# Patient Record
Sex: Female | Born: 1985 | Race: Black or African American | Hispanic: No | Marital: Married | Smoking: Never smoker
Health system: Southern US, Community
[De-identification: ages and names within clinical notes are randomized; demographics above are authoritative.]

## PROBLEM LIST (undated history)

## (undated) DIAGNOSIS — T7840XA Allergy, unspecified, initial encounter: Secondary | ICD-10-CM

## (undated) DIAGNOSIS — Z9889 Other specified postprocedural states: Secondary | ICD-10-CM

## (undated) DIAGNOSIS — Z789 Other specified health status: Secondary | ICD-10-CM

## (undated) DIAGNOSIS — R112 Nausea with vomiting, unspecified: Secondary | ICD-10-CM

## (undated) HISTORY — DX: Allergy, unspecified, initial encounter: T78.40XA

---
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
## 2004-01-04 ENCOUNTER — Other Ambulatory Visit: Admission: RE | Admit: 2004-01-04 | Discharge: 2004-01-04 | Payer: Self-pay | Admitting: Internal Medicine

## 2005-08-30 ENCOUNTER — Other Ambulatory Visit: Admission: RE | Admit: 2005-08-30 | Discharge: 2005-08-30 | Payer: Self-pay | Admitting: Internal Medicine

## 2006-09-05 ENCOUNTER — Ambulatory Visit: Payer: Self-pay | Admitting: Family Medicine

## 2006-10-08 ENCOUNTER — Ambulatory Visit: Payer: Self-pay | Admitting: Family Medicine

## 2006-10-08 LAB — CONVERTED CEMR LAB
ALT: 16 units/L (ref 0–40)
AST: 17 units/L (ref 0–37)
Albumin: 3.7 g/dL (ref 3.5–5.2)
Alkaline Phosphatase: 66 units/L (ref 39–117)
BUN: 12 mg/dL (ref 6–23)
Bilirubin, Direct: 0.1 mg/dL (ref 0.0–0.3)
CO2: 28 meq/L (ref 19–32)
Calcium: 9.6 mg/dL (ref 8.4–10.5)
Chloride: 104 meq/L (ref 96–112)
Cholesterol: 136 mg/dL (ref 0–200)
Creatinine, Ser: 0.9 mg/dL (ref 0.4–1.2)
GFR calc Af Amer: 102 mL/min
GFR calc non Af Amer: 84 mL/min
Glucose, Bld: 94 mg/dL (ref 70–99)
HDL: 45.4 mg/dL (ref >39.0)
LDL Cholesterol: 75 mg/dL (ref 0–99)
Potassium: 4.2 meq/L (ref 3.5–5.1)
Sodium: 139 meq/L (ref 135–145)
Total Bilirubin: 0.7 mg/dL (ref 0.3–1.2)
Total CHOL/HDL Ratio: 3
Total Protein: 7.4 g/dL (ref 6.0–8.3)
Triglycerides: 76 mg/dL (ref 0–149)
VLDL: 15 mg/dL (ref 0–40)

## 2006-10-09 ENCOUNTER — Other Ambulatory Visit: Admission: RE | Admit: 2006-10-09 | Discharge: 2006-10-09 | Payer: Self-pay | Admitting: Family Medicine

## 2006-10-09 ENCOUNTER — Ambulatory Visit: Payer: Self-pay | Admitting: Family Medicine

## 2006-10-09 ENCOUNTER — Encounter: Payer: Self-pay | Admitting: Family Medicine

## 2006-10-09 LAB — CONVERTED CEMR LAB
HCV Ab: NEGATIVE
Pap Smear: NORMAL

## 2006-10-16 ENCOUNTER — Encounter: Payer: Self-pay | Admitting: Family Medicine

## 2007-12-12 ENCOUNTER — Encounter: Payer: Self-pay | Admitting: Family Medicine

## 2007-12-12 ENCOUNTER — Ambulatory Visit: Payer: Self-pay | Admitting: Family Medicine

## 2007-12-12 ENCOUNTER — Other Ambulatory Visit: Admission: RE | Admit: 2007-12-12 | Discharge: 2007-12-12 | Payer: Self-pay | Admitting: Family Medicine

## 2007-12-15 LAB — CONVERTED CEMR LAB: Pap Smear: NORMAL

## 2007-12-18 LAB — CONVERTED CEMR LAB
Alkaline Phosphatase: 77 units/L (ref 39–117)
BUN: 15 mg/dL (ref 6–23)
CO2: 24 meq/L (ref 19–32)
Chloride: 103 meq/L (ref 96–112)
Creatinine, Ser: 0.83 mg/dL (ref 0.40–1.20)
Glucose, Bld: 80 mg/dL (ref 70–99)
LDL Cholesterol: 77 mg/dL (ref 0–99)
Total Bilirubin: 0.3 mg/dL (ref 0.3–1.2)

## 2007-12-22 ENCOUNTER — Telehealth: Payer: Self-pay | Admitting: Family Medicine

## 2008-07-30 ENCOUNTER — Ambulatory Visit: Payer: Self-pay | Admitting: Family Medicine

## 2008-07-30 DIAGNOSIS — J029 Acute pharyngitis, unspecified: Secondary | ICD-10-CM | POA: Insufficient documentation

## 2008-07-30 LAB — CONVERTED CEMR LAB
Beta hcg, urine, semiquantitative: POSITIVE
Bilirubin Urine: NEGATIVE
Ketones, urine, test strip: NEGATIVE
Nitrite: NEGATIVE
Rapid Strep: NEGATIVE
Specific Gravity, Urine: 1.01
Urobilinogen, UA: 0.2

## 2008-08-02 ENCOUNTER — Ambulatory Visit: Payer: Self-pay | Admitting: Family Medicine

## 2008-08-04 LAB — CONVERTED CEMR LAB: Preg, Serum: POSITIVE

## 2008-08-09 ENCOUNTER — Telehealth: Payer: Self-pay | Admitting: Family Medicine

## 2009-08-31 DIAGNOSIS — E559 Vitamin D deficiency, unspecified: Secondary | ICD-10-CM | POA: Insufficient documentation

## 2009-12-18 ENCOUNTER — Emergency Department (HOSPITAL_COMMUNITY): Admission: EM | Admit: 2009-12-18 | Discharge: 2009-12-18 | Payer: Self-pay | Admitting: Emergency Medicine

## 2011-01-12 NOTE — Assessment & Plan Note (Signed)
[facility]                           [facility] OFFICE NOTE   Tracey Bean, Tracey Bean                        MRN:          [id number]  DATE:09/05/2006                            DOB:          09-Feb-1986    NEW PATIENT H AND P   CHIEF COMPLAINT:  A 25 year old, black female here to establish new  doctor.   HISTORY OF PRESENT ILLNESS:  Tracey Bean states that she has been doing  very well and has not seen a physician since her pediatrician.  She  comes to clinic today for the following 2 rashes:  1. White spots on chest, recurrence:  She states that in the past she      has had some of the hypopigmented areas on her chest that a      previous dermatologist said was secondary to a fungal infection.      She would like to have retreatment at this point in time.  2. Darker skin areas around mouth:  She states that for several months      she has noticed some splotchy, darker areas around the skin on her      mouth.  The areas are flat and are simply hyperpigmented areas.  No      erythema, occasional dry skin.  No vesicles and no comedones.   REVIEW OF SYSTEMS:  Otherwise negative.   PAST MEDICAL HISTORY:  Obesity.   HOSPITALIZATIONS, SURGERIES, PROCEDURES:  Pap smear, August 2006,  negative.   ALLERGIES:  None.   MEDICATIONS:  1. Zovia birth control pills daily.  2. Multivitamin daily.   SOCIAL HISTORY:  No smoking.  Rare alcohol use with about 3 drinks per  year.  No history of drug use.  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  She is single but is in a relationship  right now where she is sexually active.  She did begin sex at age 28.  She uses condoms and has never had any sexually transmitted disease.  She used to get exercise at a gym every other day but had stopped  recently and she is trying to restart.  She does not drink soda, rarely  gets fruit but does get vegetables.  She drinks a lot of water.   FAMILY  HISTORY:  Father deceased at age 45 with congestive heart  failure, diabetes, stroke at a young age.  Mother alive at age 45 with  hypertension and carpal tunnel.  She has 1 sister with a heart murmur.  There is no family history of MI before age 35.  All her grandparents  did have diabetes.  She has a great aunt at age 64 who developed breast  cancer.   PHYSICAL EXAM:  Height 63 inches.  Weight 270, making BMI above 46.  Blood pressure 116/84.  Pulse 80.  Temperature 98.1.  GENERAL:  Obese-appearing female in no apparent distress.  HEENT:  PERRLA.  Extraocular muscles intact.  Oropharynx clear.  Tympanic membranes clear.  Nares clear.  No thyromegaly.  No  lymphadenopathy, supraclavicular,  or cervical.  CARDIOVASCULAR:  Regular rate and rhythm.  No murmurs, rubs, or gallops.  Normal PMI.  2+ peripheral pulses.  No peripheral edema.  LUNGS:  Clear to auscultation bilaterally.  No wheezes, rales, or  rhonchi.  ABDOMEN:  Soft, nontender.  Normoactive bowel sounds.  No  hepatosplenomegaly.  MUSCULOSKELETAL:  Strength 5/5 in upper and lower extremities.  NEURO:  Cranial nerves II through XII are grossly intact.  Sensation  intact in upper and lower extremities.  Alert and oriented x3.  SKIN:  Hypopigmented areas across chest, hyperpigmented macules around  mouth and on chin.  Mild flaky skin.  No sign of erythema.  No vesicles.  No open or closed comedones.   ASSESSMENT AND PLAN:  1. Pityriasis versicolor:  The rash on her chest appears to be      secondary to this.  She was given a course of ketoconazole 2% to      apply twice daily for 2 weeks.  2. Hyperpigmentation around mouth:  It is unclear to me at this point      in time what this is secondary to.  I will refer her to a      dermatologist for further evaluation.  3. Prevention:  Given her morbid obesity, we discussed healthy eating      practices and how to get back on a regular exercise routine.  She      will likely need  cholesterol panel and diabetes screen.  I will try      to get records from any previous doctor.  If this is unavailable,      we can go ahead and do the cholesterol and diabetes screen here.     Kerby Nora, MD  Electronically Signed    AB/MedQ  DD: 09/09/2006  DT: 09/10/2006  Job #: [id number]

## 2013-11-03 ENCOUNTER — Encounter: Payer: Self-pay | Admitting: *Deleted

## 2013-11-03 ENCOUNTER — Ambulatory Visit (INDEPENDENT_AMBULATORY_CARE_PROVIDER_SITE_OTHER): Payer: Medicaid Other | Admitting: *Deleted

## 2013-11-03 DIAGNOSIS — Z3201 Encounter for pregnancy test, result positive: Secondary | ICD-10-CM

## 2013-11-03 DIAGNOSIS — Z3490 Encounter for supervision of normal pregnancy, unspecified, unspecified trimester: Secondary | ICD-10-CM

## 2013-11-03 LAB — POCT PREGNANCY, URINE: PREG TEST UR: POSITIVE — AB

## 2013-11-03 NOTE — Progress Notes (Signed)
Pt is not sure if she wants to get her care here. She requests pregnancy verification letter. She will call back if she decides to come here.

## 2013-12-16 ENCOUNTER — Encounter: Payer: Self-pay | Admitting: Advanced Practice Midwife

## 2013-12-16 ENCOUNTER — Ambulatory Visit (INDEPENDENT_AMBULATORY_CARE_PROVIDER_SITE_OTHER): Payer: Medicaid Other | Admitting: Advanced Practice Midwife

## 2013-12-16 ENCOUNTER — Other Ambulatory Visit (HOSPITAL_COMMUNITY)
Admission: RE | Admit: 2013-12-16 | Discharge: 2013-12-16 | Disposition: A | Discharge status: Home / Self Care | Payer: Medicaid Other | Source: Ambulatory Visit | Attending: Advanced Practice Midwife | Admitting: Advanced Practice Midwife

## 2013-12-16 VITALS — BP 137/88 | HR 95 | Temp 97.5°F | Ht 64.0 in | Wt 294.9 lb

## 2013-12-16 DIAGNOSIS — Z3480 Encounter for supervision of other normal pregnancy, unspecified trimester: Secondary | ICD-10-CM

## 2013-12-16 DIAGNOSIS — O34219 Maternal care for unspecified type scar from previous cesarean delivery: Secondary | ICD-10-CM

## 2013-12-16 DIAGNOSIS — Z124 Encounter for screening for malignant neoplasm of cervix: Secondary | ICD-10-CM | POA: Insufficient documentation

## 2013-12-16 DIAGNOSIS — Z113 Encounter for screening for infections with a predominantly sexual mode of transmission: Secondary | ICD-10-CM | POA: Insufficient documentation

## 2013-12-16 LAB — POCT URINALYSIS DIP (DEVICE)
BILIRUBIN URINE: NEGATIVE
Glucose, UA: NEGATIVE mg/dL
Hgb urine dipstick: NEGATIVE
KETONES UR: NEGATIVE mg/dL
LEUKOCYTES UA: NEGATIVE
Nitrite: NEGATIVE
PH: 5.5 (ref 5.0–8.0)
Protein, ur: NEGATIVE mg/dL
Specific Gravity, Urine: 1.025 (ref 1.005–1.030)
Urobilinogen, UA: 0.2 mg/dL (ref 0.0–1.0)

## 2013-12-16 NOTE — Progress Notes (Signed)
Patient reports a few cramps yesterday

## 2013-12-16 NOTE — Patient Instructions (Signed)
Second Trimester of Pregnancy The second trimester is from week 13 through week 28, months 4 through 6. The second trimester is often a time when you feel your best. Your body has also adjusted to being pregnant, and you begin to feel better physically. Usually, morning sickness has lessened or quit completely, you may have more energy, and you may have an increase in appetite. The second trimester is also a time when the fetus is growing rapidly. At the end of the sixth month, the fetus is about 9 inches long and weighs about 1 pounds. You will likely begin to feel the baby move (quickening) between 18 and 20 weeks of the pregnancy. BODY CHANGES Your body goes through many changes during pregnancy. The changes vary from woman to woman.   Your weight will continue to increase. You will notice your lower abdomen bulging out.  You may begin to get stretch marks on your hips, abdomen, and breasts.  You may develop headaches that can be relieved by medicines approved by your caregiver.  You may urinate more often because the fetus is pressing on your bladder.  You may develop or continue to have heartburn as a result of your pregnancy.  You may develop constipation because certain hormones are causing the muscles that push waste through your intestines to slow down.  You may develop hemorrhoids or swollen, bulging veins (varicose veins).  You may have back pain because of the weight gain and pregnancy hormones relaxing your joints between the bones in your pelvis and as a result of a shift in weight and the muscles that support your balance.  Your breasts will continue to grow and be tender.  Your gums may bleed and may be sensitive to brushing and flossing.  Dark spots or blotches (chloasma, mask of pregnancy) may develop on your face. This will likely fade after the baby is born.  A dark line from your belly button to the pubic area (linea nigra) may appear. This will likely fade after the  baby is born. WHAT TO EXPECT AT YOUR PRENATAL VISITS During a routine prenatal visit:  You will be weighed to make sure you and the fetus are growing normally.  Your blood pressure will be taken.  Your abdomen will be measured to track your baby's growth.  The fetal heartbeat will be listened to.  Any test results from the previous visit will be discussed. Your caregiver may ask you:  How you are feeling.  If you are feeling the baby move.  If you have had any abnormal symptoms, such as leaking fluid, bleeding, severe headaches, or abdominal cramping.  If you have any questions. Other tests that may be performed during your second trimester include:  Blood tests that check for:  Low iron levels (anemia).  Gestational diabetes (between 24 and 28 weeks).  Rh antibodies.  Urine tests to check for infections, diabetes, or protein in the urine.  An ultrasound to confirm the proper growth and development of the baby.  An amniocentesis to check for possible genetic problems.  Fetal screens for spina bifida and Down syndrome. HOME CARE INSTRUCTIONS   Avoid all smoking, herbs, alcohol, and unprescribed drugs. These chemicals affect the formation and growth of the baby.  Follow your caregiver's instructions regarding medicine use. There are medicines that are either safe or unsafe to take during pregnancy.  Exercise only as directed by your caregiver. Experiencing uterine cramps is a good sign to stop exercising.  Continue to eat regular,   healthy meals.  Wear a good support bra for breast tenderness.  Do not use hot tubs, steam rooms, or saunas.  Wear your seat belt at all times when driving.  Avoid raw meat, uncooked cheese, cat litter boxes, and soil used by cats. These carry germs that can cause birth defects in the baby.  Take your prenatal vitamins.  Try taking a stool softener (if your caregiver approves) if you develop constipation. Eat more high-fiber foods,  such as fresh vegetables or fruit and whole grains. Drink plenty of fluids to keep your urine clear or pale yellow.  Take warm sitz baths to soothe any pain or discomfort caused by hemorrhoids. Use hemorrhoid cream if your caregiver approves.  If you develop varicose veins, wear support hose. Elevate your feet for 15 minutes, 3 4 times a day. Limit salt in your diet.  Avoid heavy lifting, wear low heel shoes, and practice good posture.  Rest with your legs elevated if you have leg cramps or low back pain.  Visit your dentist if you have not gone yet during your pregnancy. Use a soft toothbrush to brush your teeth and be gentle when you floss.  A sexual relationship may be continued unless your caregiver directs you otherwise.  Continue to go to all your prenatal visits as directed by your caregiver. SEEK MEDICAL CARE IF:   You have dizziness.  You have mild pelvic cramps, pelvic pressure, or nagging pain in the abdominal area.  You have persistent nausea, vomiting, or diarrhea.  You have a bad smelling vaginal discharge.  You have pain with urination. SEEK IMMEDIATE MEDICAL CARE IF:   You have a fever.  You are leaking fluid from your vagina.  You have spotting or bleeding from your vagina.  You have severe abdominal cramping or pain.  You have rapid weight gain or loss.  You have shortness of breath with chest pain.  You notice sudden or extreme swelling of your face, hands, ankles, feet, or legs.  You have not felt your baby move in over an hour.  You have severe headaches that do not go away with medicine.  You have vision changes. Document Released: 08/07/2001 Document Revised: 04/15/2013 Document Reviewed: 10/14/2012 ExitCare Patient Information 2014 [employer].  

## 2013-12-16 NOTE — Progress Notes (Signed)
New OB.  Routines reviewed.  See Smartset note  Subjective:    Tracey Bean is a G2P1001 [id number] being seen today for her first obstetrical visit.  Her obstetrical history is significant for slightly late to care. Patient does intend to breast feed. Pregnancy history fully reviewed.  Patient reports no complaints.  Filed Vitals:   12/16/13 1310 12/16/13 1312  BP: 137/88   Pulse: 95   Temp: 97.5 F (36.4 C)   Height:  5\' 4"  (1.626 m)  Weight: 133.766 kg (294 lb 14.4 oz)     HISTORY: OB History  Gravida Para Term Preterm AB SAB TAB Ectopic Multiple Living  2 1 1  0 0 0 0 0 0 1    # Outcome Date GA Lbr Len/2nd Weight Sex Delivery Anes PTL Lv  2 CUR           1 TRM 09/12/08   3.884 kg (8 lb 9 oz) M CS Spinal  Y     Comments: c/s due to low fetal HR; didn't know she was pregnant until delivery      History reviewed. No pertinent past medical history. Past Surgical History  Procedure Laterality Date  . Cesarean section     Family History  Problem Relation Age of Onset  . Diabetes Mother   . Diabetes Father   . Stroke Father   . Asthma Sister      Exam    Uterus:  Fundal Height: 15 cm  Pelvic Exam:    Perineum: No Hemorrhoids, Normal Perineum   Vulva: Bartholin's, Urethra, Skene's normal   Vagina:  normal mucosa, normal discharge   pH:    Cervix: no bleeding following Pap and no cervical motion tenderness   Adnexa: normal adnexa and no mass, fullness, tenderness   Bony Pelvis: gynecoid  System: Breast:  normal appearance, no masses or tenderness   Skin: normal coloration and turgor, no rashes    Neurologic: oriented, grossly non-focal   Extremities: normal strength, tone, and muscle mass   HEENT neck supple with midline trachea   Mouth/Teeth mucous membranes moist, pharynx normal without lesions   Neck supple and no masses   Cardiovascular: regular rate and rhythm, no murmurs or gallops   Respiratory:  appears well, vitals normal, no respiratory distress,  acyanotic, normal RR, ear and throat exam is normal, neck free of mass or lymphadenopathy, chest clear, no wheezing, crepitations, rhonchi, normal symmetric air entry   Abdomen: soft, non-tender; bowel sounds normal; no masses,  no organomegaly   Urinary: urethral meatus normal      Assessment:    Pregnancy: G2P1001 Patient Active Problem List   Diagnosis Date Noted  . Previous cesarean delivery affecting pregnancy, antepartum 12/16/2013  . SORE THROAT 07/30/2008  . OBESITY, MORBID 10/16/2006        Plan:     Initial labs drawn. Prenatal vitamins. Problem list reviewed and updated. Genetic Screening discussed Quad Screen: reviewed. Order next visit.  Ultrasound discussed; fetal survey: order next visit.  Follow up in 4 weeks. 50% of 30 min visit spent on counseling and coordination of care.     Aviva SignsMarie L Takyla Kuchera 12/16/2013

## 2013-12-17 LAB — OBSTETRIC PANEL
Antibody Screen: NEGATIVE
BASOS ABS: 0 10*3/uL (ref 0.0–0.1)
BASOS PCT: 0 % (ref 0–1)
Eosinophils Absolute: 0.1 10*3/uL (ref 0.0–0.7)
Eosinophils Relative: 2 % (ref 0–5)
HEMATOCRIT: 32.8 % — AB (ref 36.0–46.0)
HEMOGLOBIN: 11.1 g/dL — AB (ref 12.0–15.0)
Hepatitis B Surface Ag: NEGATIVE
Lymphocytes Relative: 27 % (ref 12–46)
Lymphs Abs: 2 10*3/uL (ref 0.7–4.0)
MCH: 27.2 pg (ref 26.0–34.0)
MCHC: 33.8 g/dL (ref 30.0–36.0)
MCV: 80.4 fL (ref 78.0–100.0)
MONOS PCT: 9 % (ref 3–12)
Monocytes Absolute: 0.7 10*3/uL (ref 0.1–1.0)
NEUTROS ABS: 4.6 10*3/uL (ref 1.7–7.7)
Neutrophils Relative %: 62 % (ref 43–77)
Platelets: 231 10*3/uL (ref 150–400)
RBC: 4.08 MIL/uL (ref 3.87–5.11)
RDW: 15 % (ref 11.5–15.5)
Rh Type: POSITIVE
Rubella: 1.87 Index — ABNORMAL HIGH (ref <0.90)
WBC: 7.4 10*3/uL (ref 4.0–10.5)

## 2013-12-17 LAB — GC/CHLAMYDIA PROBE AMP
CT Probe RNA: NEGATIVE
GC PROBE AMP APTIMA: NEGATIVE

## 2013-12-17 LAB — PRESCRIPTION MONITORING PROFILE (19 PANEL)
Amphetamine/Meth: NEGATIVE ng/mL
BUPRENORPHINE, URINE: NEGATIVE ng/mL
Barbiturate Screen, Urine: NEGATIVE ng/mL
Benzodiazepine Screen, Urine: NEGATIVE ng/mL
COCAINE METABOLITES: NEGATIVE ng/mL
Cannabinoid Scrn, Ur: NEGATIVE ng/mL
Carisoprodol, Urine: NEGATIVE ng/mL
Creatinine, Urine: 204.88 mg/dL (ref >20.0)
ECSTASY: NEGATIVE ng/mL
FENTANYL URINE: NEGATIVE ng/mL
MEPERIDINE UR: NEGATIVE ng/mL
Methadone Screen, Urine: NEGATIVE ng/mL
Methaqualone: NEGATIVE ng/mL
Nitrites, Initial: NEGATIVE ug/mL
Opiate Screen, Urine: NEGATIVE ng/mL
Oxycodone Screen, Ur: NEGATIVE ng/mL
Phencyclidine, Ur: NEGATIVE ng/mL
Propoxyphene: NEGATIVE ng/mL
Tapentadol, urine: NEGATIVE ng/mL
Tramadol Scrn, Ur: NEGATIVE ng/mL
ZOLPIDEM, URINE: NEGATIVE ng/mL
pH, Initial: 5.8 pH (ref 4.5–8.9)

## 2013-12-17 LAB — GLUCOSE TOLERANCE, 1 HOUR (50G) W/O FASTING: Glucose, 1 Hour GTT: 102 mg/dL (ref 70–140)

## 2013-12-17 LAB — HIV ANTIBODY (ROUTINE TESTING W REFLEX): HIV 1&2 Ab, 4th Generation: NONREACTIVE

## 2013-12-18 LAB — HEMOGLOBINOPATHY EVALUATION
HEMOGLOBIN OTHER: 0 %
HGB A2 QUANT: 2.4 % (ref 2.2–3.2)
HGB F QUANT: 0 % (ref 0.0–2.0)
Hgb A: 97.6 % (ref 96.8–97.8)
Hgb S Quant: 0 %

## 2013-12-18 LAB — CULTURE, OB URINE
Colony Count: NO GROWTH
Organism ID, Bacteria: NO GROWTH

## 2013-12-22 ENCOUNTER — Encounter: Payer: Self-pay | Admitting: Advanced Practice Midwife

## 2013-12-22 DIAGNOSIS — IMO0002 Reserved for concepts with insufficient information to code with codable children: Secondary | ICD-10-CM

## 2013-12-22 HISTORY — DX: Reserved for concepts with insufficient information to code with codable children: IMO0002

## 2013-12-25 ENCOUNTER — Telehealth: Payer: Self-pay | Admitting: General Practice

## 2013-12-25 NOTE — Telephone Encounter (Signed)
Called patient and informed her of results and recommendations. Told patient at her next visit they can talk to her about the colposcopy and answer additional questions she may have and more than likely we can get that scheduled at her next visit as well. Patient verbalized understanding to all and had no further questions.

## 2013-12-25 NOTE — Telephone Encounter (Signed)
Message copied by Kathee DeltonHILLMAN, Chavy Avera L on Fri Dec 25, 2013  9:14 AM ------      Message from: Aviva SignsWILLIAMS, MARIE L      Created: Tue Dec 22, 2013  5:07 PM       Probably needs Colpo. No HPV done            Hilda LiasMarie            ----- Message -----         From: Kathee Deltonarrie L Keiasia Christianson, RN         Sent: 12/22/2013  11:02 AM           To: Aviva SignsMarie L Williams, CNM            Hello. This patient's pap results are back. Please advise on recommendations for patient so we can call her       ------

## 2014-01-13 ENCOUNTER — Encounter: Payer: Self-pay | Admitting: Advanced Practice Midwife

## 2014-01-13 ENCOUNTER — Ambulatory Visit (INDEPENDENT_AMBULATORY_CARE_PROVIDER_SITE_OTHER): Payer: Self-pay | Admitting: Advanced Practice Midwife

## 2014-01-13 VITALS — BP 134/85 | HR 100 | Temp 98.2°F | Wt 293.6 lb

## 2014-01-13 DIAGNOSIS — Z3480 Encounter for supervision of other normal pregnancy, unspecified trimester: Secondary | ICD-10-CM

## 2014-01-13 DIAGNOSIS — Z3490 Encounter for supervision of normal pregnancy, unspecified, unspecified trimester: Secondary | ICD-10-CM

## 2014-01-13 LAB — POCT URINALYSIS DIP (DEVICE)
Bilirubin Urine: NEGATIVE
GLUCOSE, UA: NEGATIVE mg/dL
Hgb urine dipstick: NEGATIVE
Ketones, ur: NEGATIVE mg/dL
Leukocytes, UA: NEGATIVE
Nitrite: NEGATIVE
Protein, ur: NEGATIVE mg/dL
SPECIFIC GRAVITY, URINE: 1.015 (ref 1.005–1.030)
Urobilinogen, UA: 0.2 mg/dL (ref 0.0–1.0)
pH: 6.5 (ref 5.0–8.0)

## 2014-01-13 NOTE — Patient Instructions (Signed)
Abnormal Pap Test Information During a Pap test, the cells on the surface of your cervix are checked to see if they look normal, abnormal, or if they show signs of having been altered by a certain type of virus called human papillomavirus, or HPV. Cervical cells that have been affected by HPV are called dysplasia. Dysplasia is not cancer, but describes abnormal cells found on the surface of the cervix. Depending on the degree of dysplasia, some of the cells may be considered pre-cancerous and may turn into cancer over time if follow up with a caregiver is delayed.  WHAT DOES AN ABNORMAL PAP TEST MEAN? Having an abnormal pap test does not mean that you have cancer. However, certain types of abnormal pap tests can be a sign that a person is at a higher risk of developing cancer. Your caregiver will want to do other tests to find out more about the abnormal cells. Your abnormal Pap test results could show:   Small and uncertain changes that should be carefully watched.   Cervical dysplasia that has caused mild changes and can be followed over time.  Cervical dysplasia that is more severe and needs to be followed and treated to ensure the problem goes away.  Cancer.  When severe cervical dysplasia is found and treated early, it rarely will grow into cancer.  WHAT WILL BE DONE ABOUT MY ABNORMAL PAP TEST?  A colposcopy may be needed. This is a procedure where your cervix is examined using light and magnification.  A small tissue sample of your cervix (biopsy) may need to be removed and then examined. This is often performed if there are areas that appear infected.  A sample of cells from the cervical canal may be removed with either a small brush or scraping instrument (curette). Based on the results of the procedures above, some caregivers may recommend either cryotherapy of the cervix or a surgical LEEP where a portion of the cervix is removed. LEEP is short for "loop electrical excisional  procedure." Rarely, a caregiver may recommend a cone biopsy.This is a procedure where a small, cone-shaped sample of your cervix is taken out. The part that is taken out is the area where the abnormal cells are.  WHAT IF I HAVE A DYSPLASIA OR A CANCER? You may be referred to a specialist. Radiation may also be a treatment for more advanced cancer. Having a hysterectomy is the last treatment option for dysplasia, but it is a more common treatment for someone with cancer. All treatment options will be discussed with you by your caregiver. WHAT SHOULD YOU DO AFTER BEING TREATED? If you have had an abnormal pap test, you should continue to have regular pap tests and check-ups as directed by your caregiver. Your cervical problem will be carefully watched so it does not get worse. Also, your caregiver can watch for, and treat, any new problems that may come up. Document Released: 11/28/2010 Document Revised: 12/08/2012 Document Reviewed: 08/09/2011 [facility]Exit[facility] Patient Information 2014 [place]ExitCare, Maryland[place].   Colposcopy Colposcopy is a procedure to examine your cervix and vagina, or the area around the outside of your vagina, for abnormalities or signs of disease. The procedure is done using a lighted microscope called a colposcope. Tissue samples may be collected during the colposcopy if your health care provider finds any unusual cells. A colposcopy may be done if a woman has:  An abnormal Pap test. A Pap test is a medical test done to evaluate cells that are on the surface of  the cervix.  A Pap test result that is suggestive of human papillomavirus (HPV). This virus can cause genital warts and is linked to the development of cervical cancer.  A sore on her cervix and the results of a Pap test were normal.  Genital warts on the cervix or in or around the outside of the vagina.  A mother who took the drug diethylstilbestrol (DES) while pregnant.  Painful intercourse.  Vaginal bleeding, especially after  sexual intercourse. LET [facility] CenterYOUR HEALTH CARE PROVIDER KNOW ABOUT:  Any allergies you have.  All medicines you are taking, including vitamins, herbs, eye drops, creams, and over-the-counter medicines.  Previous problems you or members of your family have had with the use of anesthetics.  Any blood disorders you have.  Previous surgeries you have had.  Medical conditions you have. RISKS AND COMPLICATIONS Generally, a colposcopy is a safe procedure. However, as with any procedure, complications can occur. Possible complications include:  Bleeding.  Infection.  Missed lesions. BEFORE THE PROCEDURE   Tell your health care provider if you have your menstrual period. A colposcopy typically is not done during menstruation.  For 24 hours before the colposcopy, do not:  Douche.  Use tampons.  Use medicines, creams, or suppositories in the vagina.  Have sexual intercourse. PROCEDURE  During the procedure, you will be lying on your back with your feet in foot rests (stirrups). A warm metal or plastic instrument (speculum) will be placed in your vagina to keep it open and to allow the health care provider to see the cervix. The colposcope will be placed outside the vagina. It will be used to magnify and examine the cervix, vagina, and the area around the outside of the vagina. A small amount of liquid solution will be placed on the area that is to be viewed. This solution will make it easier to see the abnormal cells. Your health care provider will use tools to suck out mucus and cells from the canal of the cervix. Then he or she will record the location of the abnormal areas. If a biopsy is done during the procedure, a medicine will usually be given to numb the area (local anesthetic). You may feel mild pain or cramping while the biopsy is done. After the procedure, tissue samples collected during the biopsy will be sent to a lab for analysis. AFTER THE PROCEDURE  You will be given  instructions on when to follow up with your health care provider for your test results. It is important to keep your appointment. Document Released: 11/03/2002 Document Revised: 04/15/2013 Document Reviewed: 03/12/2013 [facility] HospitalExit[facility] Patient Information 2014 [place] MillExitCare, MarylandLLC.

## 2014-01-13 NOTE — Progress Notes (Signed)
Discussed LGSIL pap. Need to schedule colposcopy.  Will schedule anatomy US for 2 weeks from now.

## 2014-01-13 NOTE — Progress Notes (Signed)
C/o of cough and cold over the weekend with weakness.  C/o of lower back pain and pain at C-sec scar.

## 2014-01-20 ENCOUNTER — Other Ambulatory Visit: Payer: Self-pay | Admitting: Advanced Practice Midwife

## 2014-01-20 ENCOUNTER — Ambulatory Visit (HOSPITAL_COMMUNITY)
Admission: RE | Admit: 2014-01-20 | Discharge: 2014-01-20 | Disposition: A | Discharge status: Home / Self Care | Payer: Medicaid Other | Source: Ambulatory Visit | Attending: Advanced Practice Midwife | Admitting: Advanced Practice Midwife

## 2014-01-20 DIAGNOSIS — Z3490 Encounter for supervision of normal pregnancy, unspecified, unspecified trimester: Secondary | ICD-10-CM

## 2014-01-20 DIAGNOSIS — Z3689 Encounter for other specified antenatal screening: Secondary | ICD-10-CM | POA: Insufficient documentation

## 2014-01-24 ENCOUNTER — Encounter: Payer: Self-pay | Admitting: Advanced Practice Midwife

## 2014-02-03 ENCOUNTER — Ambulatory Visit (INDEPENDENT_AMBULATORY_CARE_PROVIDER_SITE_OTHER): Payer: Medicaid Other | Admitting: Obstetrics & Gynecology

## 2014-02-03 ENCOUNTER — Encounter: Payer: Self-pay | Admitting: Obstetrics and Gynecology

## 2014-02-03 VITALS — BP 137/79 | HR 109 | Temp 98.8°F | Wt 297.7 lb

## 2014-02-03 DIAGNOSIS — R6889 Other general symptoms and signs: Secondary | ICD-10-CM

## 2014-02-03 DIAGNOSIS — IMO0002 Reserved for concepts with insufficient information to code with codable children: Secondary | ICD-10-CM

## 2014-02-03 DIAGNOSIS — Z1389 Encounter for screening for other disorder: Secondary | ICD-10-CM

## 2014-02-03 DIAGNOSIS — O34219 Maternal care for unspecified type scar from previous cesarean delivery: Secondary | ICD-10-CM

## 2014-02-03 DIAGNOSIS — Z0489 Encounter for examination and observation for other specified reasons: Secondary | ICD-10-CM

## 2014-02-03 LAB — POCT URINALYSIS DIP (DEVICE)
Bilirubin Urine: NEGATIVE
Glucose, UA: NEGATIVE mg/dL
Hgb urine dipstick: NEGATIVE
KETONES UR: NEGATIVE mg/dL
Leukocytes, UA: NEGATIVE
Nitrite: NEGATIVE
Protein, ur: NEGATIVE mg/dL
Specific Gravity, Urine: <=1.005 (ref 1.005–1.030)
Urobilinogen, UA: 0.2 mg/dL (ref 0.0–1.0)
pH: 6 (ref 5.0–8.0)

## 2014-02-03 NOTE — Patient Instructions (Signed)
Return to clinic for any obstetric concerns or go to [facility] for evaluation  

## 2014-02-03 NOTE — Progress Notes (Signed)
    COLPOSCOPY PROCEDURE NOTE  28 y.o. G2P1001 at [redacted]w[redacted]d here for colposcopy for low-grade squamous intraepithelial neoplasia (LGSIL - encompassing HPV,mild dysplasia,CIN I) pap smear on 12/16/13. Discussed role for HPV in cervical dysplasia, need for surveillance.  Patient given informed consent, signed copy in the chart, time out was performed. FHR 155.  Placed in lithotomy position. Cervix viewed with speculum and colposcope after application of acetic acid.   Colposcopy adequate? Yes No visible lesions; no biopsies obtained.    Will schedule follow up anatomy scan given inadequate scan previously.  No other complaints or concerns.  Fetal movement and labor precautions reviewed.  Return in 4 weeks.   Jaynie Collins, MD, FACOG Attending Obstetrician & Gynecologist Faculty Practice, [facility]

## 2014-02-03 NOTE — Progress Notes (Signed)
Patient here today for LOBFU and colpo. Reports occasional edema in feet and occasional lower back pain. Denies contractions,  vaginal bleeding or discharge. Reports baby is moving well.

## 2014-02-10 ENCOUNTER — Encounter: Payer: Medicaid Other | Admitting: Advanced Practice Midwife

## 2014-02-17 ENCOUNTER — Ambulatory Visit (HOSPITAL_COMMUNITY)
Admission: RE | Admit: 2014-02-17 | Discharge: 2014-02-17 | Disposition: A | Discharge status: Home / Self Care | Payer: Medicaid Other | Source: Ambulatory Visit | Attending: Obstetrics & Gynecology | Admitting: Obstetrics & Gynecology

## 2014-02-17 DIAGNOSIS — Z363 Encounter for antenatal screening for malformations: Secondary | ICD-10-CM | POA: Insufficient documentation

## 2014-02-17 DIAGNOSIS — Z0489 Encounter for examination and observation for other specified reasons: Secondary | ICD-10-CM

## 2014-02-17 DIAGNOSIS — O34219 Maternal care for unspecified type scar from previous cesarean delivery: Secondary | ICD-10-CM

## 2014-02-17 DIAGNOSIS — Z1389 Encounter for screening for other disorder: Secondary | ICD-10-CM | POA: Insufficient documentation

## 2014-02-17 DIAGNOSIS — O99210 Obesity complicating pregnancy, unspecified trimester: Secondary | ICD-10-CM

## 2014-02-17 DIAGNOSIS — IMO0002 Reserved for concepts with insufficient information to code with codable children: Secondary | ICD-10-CM

## 2014-02-17 DIAGNOSIS — E669 Obesity, unspecified: Secondary | ICD-10-CM | POA: Insufficient documentation

## 2014-02-18 ENCOUNTER — Encounter: Payer: Self-pay | Admitting: Obstetrics & Gynecology

## 2014-03-10 ENCOUNTER — Encounter: Payer: Self-pay | Admitting: Obstetrics and Gynecology

## 2014-03-10 ENCOUNTER — Ambulatory Visit (INDEPENDENT_AMBULATORY_CARE_PROVIDER_SITE_OTHER): Payer: Medicaid Other | Admitting: Obstetrics and Gynecology

## 2014-03-10 VITALS — BP 128/78 | HR 98 | Temp 98.4°F | Wt 300.8 lb

## 2014-03-10 DIAGNOSIS — O34219 Maternal care for unspecified type scar from previous cesarean delivery: Secondary | ICD-10-CM

## 2014-03-10 DIAGNOSIS — E669 Obesity, unspecified: Secondary | ICD-10-CM

## 2014-03-10 DIAGNOSIS — Z23 Encounter for immunization: Secondary | ICD-10-CM

## 2014-03-10 DIAGNOSIS — O99210 Obesity complicating pregnancy, unspecified trimester: Secondary | ICD-10-CM

## 2014-03-10 DIAGNOSIS — O99212 Obesity complicating pregnancy, second trimester: Principal | ICD-10-CM

## 2014-03-10 LAB — CBC
HCT: 30.7 % — ABNORMAL LOW (ref 36.0–46.0)
Hemoglobin: 10.5 g/dL — ABNORMAL LOW (ref 12.0–15.0)
MCH: 27 pg (ref 26.0–34.0)
MCHC: 34.2 g/dL (ref 30.0–36.0)
MCV: 78.9 fL (ref 78.0–100.0)
PLATELETS: 220 10*3/uL (ref 150–400)
RBC: 3.89 MIL/uL (ref 3.87–5.11)
RDW: 15.5 % (ref 11.5–15.5)
WBC: 9.7 10*3/uL (ref 4.0–10.5)

## 2014-03-10 LAB — POCT URINALYSIS DIP (DEVICE)
Bilirubin Urine: NEGATIVE
Glucose, UA: NEGATIVE mg/dL
HGB URINE DIPSTICK: NEGATIVE
Ketones, ur: 15 mg/dL — AB
Leukocytes, UA: NEGATIVE
Nitrite: NEGATIVE
PH: 6 (ref 5.0–8.0)
Protein, ur: NEGATIVE mg/dL
SPECIFIC GRAVITY, URINE: 1.015 (ref 1.005–1.030)
UROBILINOGEN UA: 0.2 mg/dL (ref 0.0–1.0)

## 2014-03-10 MED ORDER — TETANUS-DIPHTH-ACELL PERTUSSIS 5-2.5-18.5 LF-MCG/0.5 IM SUSP: 0.5000 mL | Freq: Once | INTRAMUSCULAR | Status: DC

## 2014-03-10 NOTE — Progress Notes (Signed)
US for interval growth and F/U anatomy scheduled. Doesn't know gender yet. 28 wk labs done. Colpo was OK. Reviewed PN lab results. Signed TOLAC consent  (max dil 5 cm, fetal stress, 8#9) Plans CPR class.

## 2014-03-10 NOTE — Progress Notes (Signed)
Reports occasional pelvic pain when standing for long times.  1hr gtt and labs today; to be drawn at 1214. Tdap today.

## 2014-03-10 NOTE — Patient Instructions (Addendum)
Second Trimester of Pregnancy The second trimester is from week 13 through week 28, months 4 through 6. The second trimester is often a time when you feel your best. Your body has also adjusted to being pregnant, and you begin to feel better physically. Usually, morning sickness has lessened or quit completely, you may have more energy, and you may have an increase in appetite. The second trimester is also a time when the fetus is growing rapidly. At the end of the sixth month, the fetus is about 9 inches long and weighs about 1 pounds. You will likely begin to feel the baby move (quickening) between 18 and 20 weeks of the pregnancy. BODY CHANGES Your body goes through many changes during pregnancy. The changes vary from woman to woman.   Your weight will continue to increase. You will notice your lower abdomen bulging out.  You may begin to get stretch marks on your hips, abdomen, and breasts.  You may develop headaches that can be relieved by medicines approved by your health care provider.  You may urinate more often because the fetus is pressing on your bladder.  You may develop or continue to have heartburn as a result of your pregnancy.  You may develop constipation because certain hormones are causing the muscles that push waste through your intestines to slow down.  You may develop hemorrhoids or swollen, bulging veins (varicose veins).  You may have back pain because of the weight gain and pregnancy hormones relaxing your joints between the bones in your pelvis and as a result of a shift in weight and the muscles that support your balance.  Your breasts will continue to grow and be tender.  Your gums may bleed and may be sensitive to brushing and flossing.  Dark spots or blotches (chloasma, mask of pregnancy) may develop on your face. This will likely fade after the baby is born.  A dark line from your belly button to the pubic area (linea nigra) may appear. This will likely fade  after the baby is born.  You may have changes in your hair. These can include thickening of your hair, rapid growth, and changes in texture. Some women also have hair loss during or after pregnancy, or hair that feels dry or thin. Your hair will most likely return to normal after your baby is born. WHAT TO EXPECT AT YOUR PRENATAL VISITS During a routine prenatal visit:  You will be weighed to make sure you and the fetus are growing normally.  Your blood pressure will be taken.  Your abdomen will be measured to track your baby's growth.  The fetal heartbeat will be listened to.  Any test results from the previous visit will be discussed. Your health care provider may ask you:  How you are feeling.  If you are feeling the baby move.  If you have had any abnormal symptoms, such as leaking fluid, bleeding, severe headaches, or abdominal cramping.  If you have any questions. Other tests that may be performed during your second trimester include:  Blood tests that check for:  Low iron levels (anemia).  Gestational diabetes (between 24 and 28 weeks).  Rh antibodies.  Urine tests to check for infections, diabetes, or protein in the urine.  An ultrasound to confirm the proper growth and development of the baby.  An amniocentesis to check for possible genetic problems.  Fetal screens for spina bifida and Down syndrome. HOME CARE INSTRUCTIONS   Avoid all smoking, herbs, alcohol, and unprescribed   drugs. These chemicals affect the formation and growth of the baby.  Follow your health care provider's instructions regarding medicine use. There are medicines that are either safe or unsafe to take during pregnancy.  Exercise only as directed by your health care provider. Experiencing uterine cramps is a good sign to stop exercising.  Continue to eat regular, healthy meals.  Wear a good support bra for breast tenderness.  Do not use hot tubs, steam rooms, or saunas.  Wear your  seat belt at all times when driving.  Avoid raw meat, uncooked cheese, cat litter boxes, and soil used by cats. These carry germs that can cause birth defects in the baby.  Take your prenatal vitamins.  Try taking a stool softener (if your health care provider approves) if you develop constipation. Eat more high-fiber foods, such as fresh vegetables or fruit and whole grains. Drink plenty of fluids to keep your urine clear or pale yellow.  Take warm sitz baths to soothe any pain or discomfort caused by hemorrhoids. Use hemorrhoid cream if your health care provider approves.  If you develop varicose veins, wear support hose. Elevate your feet for 15 minutes, 3-4 times a day. Limit salt in your diet.  Avoid heavy lifting, wear low heel shoes, and practice good posture.  Rest with your legs elevated if you have leg cramps or low back pain.  Visit your dentist if you have not gone yet during your pregnancy. Use a soft toothbrush to brush your teeth and be gentle when you floss.  A sexual relationship may be continued unless your health care provider directs you otherwise.  Continue to go to all your prenatal visits as directed by your health care provider. SEEK MEDICAL CARE IF:   You have dizziness.  You have mild pelvic cramps, pelvic pressure, or nagging pain in the abdominal area.  You have persistent nausea, vomiting, or diarrhea.  You have a bad smelling vaginal discharge.  You have pain with urination. SEEK IMMEDIATE MEDICAL CARE IF:   You have a fever.  You are leaking fluid from your vagina.  You have spotting or bleeding from your vagina.  You have severe abdominal cramping or pain.  You have rapid weight gain or loss.  You have shortness of breath with chest pain.  You notice sudden or extreme swelling of your face, hands, ankles, feet, or legs.  You have not felt your baby move in over an hour.  You have severe headaches that do not go away with  medicine.  You have vision changes. Document Released: 08/07/2001 Document Revised: 08/18/2013 Document Reviewed: 10/14/2012 [facility] Patient Information 2015 [place], [place]. This information is not intended to replace advice given to you by your health care provider. Make sure you discuss any questions you have with your health care provider. Abnormal Pap Test Information During a Pap test, the cells on the surface of your cervix are checked to see if they look normal, abnormal, or if they show signs of having been altered by a certain type of virus called human papillomavirus, or HPV. Cervical cells that have been affected by HPV are called dysplasia. Dysplasia is not cancer, but describes abnormal cells found on the surface of the cervix. Depending on the degree of dysplasia, some of the cells may be considered pre-cancerous and may turn into cancer over time if follow up with a caregiver is delayed.  WHAT DOES AN ABNORMAL PAP TEST MEAN? Having an abnormal pap test does not mean that you  have cancer. However, certain types of abnormal pap tests can be a sign that a person is at a higher risk of developing cancer. Your caregiver will want to do other tests to find out more about the abnormal cells. Your abnormal Pap test results could show:   Small and uncertain changes that should be carefully watched.   Cervical dysplasia that has caused mild changes and can be followed over time.  Cervical dysplasia that is more severe and needs to be followed and treated to ensure the problem goes away. Third Trimester of Pregnancy The third trimester is from week 29 through week 42, months 7 through 9. The third trimester is a time when the fetus is growing rapidly. At the end of the ninth month, the fetus is about 20 inches in length and weighs 6-10 pounds.  BODY CHANGES Your body goes through many changes during pregnancy. The changes vary from woman to woman.   Your weight will continue to increase.  You can expect to gain 25-35 pounds (11-16 kg) by the end of the pregnancy.  You may begin to get stretch marks on your hips, abdomen, and breasts.  You may urinate more often because the fetus is moving lower into your pelvis and pressing on your bladder.  You may develop or continue to have heartburn as a result of your pregnancy.  You may develop constipation because certain hormones are causing the muscles that push waste through your intestines to slow down.  You may develop hemorrhoids or swollen, bulging veins (varicose veins).  You may have pelvic pain because of the weight gain and pregnancy hormones relaxing your joints between the bones in your pelvis. Backaches may result from overexertion of the muscles supporting your posture.  You may have changes in your hair. These can include thickening of your hair, rapid growth, and changes in texture. Some women also have hair loss during or after pregnancy, or hair that feels dry or thin. Your hair will most likely return to normal after your baby is born.  Your breasts will continue to grow and be tender. A yellow discharge may leak from your breasts called colostrum.  Your belly button may stick out.  You may feel short of breath because of your expanding uterus.  You may notice the fetus "dropping," or moving lower in your abdomen.  You may have a bloody mucus discharge. This usually occurs a few days to a week before labor begins.  Your cervix becomes thin and soft (effaced) near your due date. WHAT TO EXPECT AT YOUR PRENATAL EXAMS  You will have prenatal exams every 2 weeks until week 36. Then, you will have weekly prenatal exams. During a routine prenatal visit:  You will be weighed to make sure you and the fetus are growing normally.  Your blood pressure is taken.  Your abdomen will be measured to track your baby's growth.  The fetal heartbeat will be listened to.  Any test results from the previous visit will be  discussed.  You may have a cervical check near your due date to see if you have effaced. At around 36 weeks, your caregiver will check your cervix. At the same time, your caregiver will also perform a test on the secretions of the vaginal tissue. This test is to determine if a type of bacteria, Group B streptococcus, is present. Your caregiver will explain this further. Your caregiver may ask you:  What your birth plan is.  How you are feeling.  If you are feeling the baby move.  If you have had any abnormal symptoms, such as leaking fluid, bleeding, severe headaches, or abdominal cramping.  If you have any questions. Other tests or screenings that may be performed during your third trimester include:  Blood tests that check for low iron levels (anemia).  Fetal testing to check the health, activity level, and growth of the fetus. Testing is done if you have certain medical conditions or if there are problems during the pregnancy. FALSE LABOR You may feel small, irregular contractions that eventually go away. These are called Braxton Hicks contractions, or false labor. Contractions may last for hours, days, or even weeks before true labor sets in. If contractions come at regular intervals, intensify, or become painful, it is best to be seen by your caregiver.  SIGNS OF LABOR   Menstrual-like cramps.  Contractions that are 5 minutes apart or less.  Contractions that start on the top of the uterus and spread down to the lower abdomen and back.  A sense of increased pelvic pressure or back pain.  A watery or bloody mucus discharge that comes from the vagina. If you have any of these signs before the 37th week of pregnancy, call your caregiver right away. You need to go to the hospital to get checked immediately. HOME CARE INSTRUCTIONS   Avoid all smoking, herbs, alcohol, and unprescribed drugs. These chemicals affect the formation and growth of the baby.  Follow your caregiver's  instructions regarding medicine use. There are medicines that are either safe or unsafe to take during pregnancy.  Exercise only as directed by your caregiver. Experiencing uterine cramps is a good sign to stop exercising.  Continue to eat regular, healthy meals.  Wear a good support bra for breast tenderness.  Do not use hot tubs, steam rooms, or saunas.  Wear your seat belt at all times when driving.  Avoid raw meat, uncooked cheese, cat litter boxes, and soil used by cats. These carry germs that can cause birth defects in the baby.  Take your prenatal vitamins.  Try taking a stool softener (if your caregiver approves) if you develop constipation. Eat more high-fiber foods, such as fresh vegetables or fruit and whole grains. Drink plenty of fluids to keep your urine clear or pale yellow.  Take warm sitz baths to soothe any pain or discomfort caused by hemorrhoids. Use hemorrhoid cream if your caregiver approves.  If you develop varicose veins, wear support hose. Elevate your feet for 15 minutes, 3-4 times a day. Limit salt in your diet.  Avoid heavy lifting, wear low heal shoes, and practice good posture.  Rest a lot with your legs elevated if you have leg cramps or low back pain.  Visit your dentist if you have not gone during your pregnancy. Use a soft toothbrush to brush your teeth and be gentle when you floss.  A sexual relationship may be continued unless your caregiver directs you otherwise.  Do not travel far distances unless it is absolutely necessary and only with the approval of your caregiver.  Take prenatal classes to understand, practice, and ask questions about the labor and delivery.  Make a trial run to the hospital.  Pack your hospital bag.  Prepare the baby's nursery.  Continue to go to all your prenatal visits as directed by your caregiver. SEEK MEDICAL CARE IF:  You are unsure if you are in labor or if your water has broken.  You have  dizziness.  You have  mild pelvic cramps, pelvic pressure, or nagging pain in your abdominal area.  You have persistent nausea, vomiting, or diarrhea.  You have a bad smelling vaginal discharge.  You have pain with urination. SEEK IMMEDIATE MEDICAL CARE IF:   You have a fever.  You are leaking fluid from your vagina.  You have spotting or bleeding from your vagina.  You have severe abdominal cramping or pain.  You have rapid weight loss or gain.  You have shortness of breath with chest pain.  You notice sudden or extreme swelling of your face, hands, ankles, feet, or legs.  You have not felt your baby move in over an hour.  You have severe headaches that do not go away with medicine.  You have vision changes. Document Released: 08/07/2001 Document Revised: 08/18/2013 Document Reviewed: 10/14/2012 [facility] Patient Information 2015 [place], [place]. This information is not intended to replace advice given to you by your health care provider. Make sure you discuss any questions you have with your health care provider.

## 2014-03-11 LAB — GLUCOSE TOLERANCE, 1 HOUR (50G) W/O FASTING: Glucose, 1 Hour GTT: 115 mg/dL (ref 70–140)

## 2014-03-11 LAB — RPR

## 2014-03-11 LAB — HIV ANTIBODY (ROUTINE TESTING W REFLEX): HIV 1&2 Ab, 4th Generation: NONREACTIVE

## 2014-03-18 ENCOUNTER — Ambulatory Visit (HOSPITAL_COMMUNITY)
Admission: RE | Admit: 2014-03-18 | Discharge: 2014-03-18 | Disposition: A | Discharge status: Home / Self Care | Payer: Medicaid Other | Source: Ambulatory Visit | Attending: Obstetrics and Gynecology | Admitting: Obstetrics and Gynecology

## 2014-03-18 DIAGNOSIS — O99210 Obesity complicating pregnancy, unspecified trimester: Secondary | ICD-10-CM | POA: Diagnosis present

## 2014-03-18 DIAGNOSIS — Z3689 Encounter for other specified antenatal screening: Secondary | ICD-10-CM | POA: Insufficient documentation

## 2014-03-18 DIAGNOSIS — E669 Obesity, unspecified: Secondary | ICD-10-CM | POA: Diagnosis not present

## 2014-03-18 DIAGNOSIS — O34219 Maternal care for unspecified type scar from previous cesarean delivery: Secondary | ICD-10-CM

## 2014-03-18 DIAGNOSIS — O99212 Obesity complicating pregnancy, second trimester: Secondary | ICD-10-CM

## 2014-04-02 ENCOUNTER — Ambulatory Visit (INDEPENDENT_AMBULATORY_CARE_PROVIDER_SITE_OTHER): Payer: Medicaid Other | Admitting: Nurse Practitioner

## 2014-04-02 VITALS — BP 115/57 | HR 97 | Temp 97.8°F | Wt 301.3 lb

## 2014-04-02 DIAGNOSIS — O99210 Obesity complicating pregnancy, unspecified trimester: Secondary | ICD-10-CM

## 2014-04-02 DIAGNOSIS — O99213 Obesity complicating pregnancy, third trimester: Secondary | ICD-10-CM

## 2014-04-02 DIAGNOSIS — O34219 Maternal care for unspecified type scar from previous cesarean delivery: Secondary | ICD-10-CM

## 2014-04-02 DIAGNOSIS — J029 Acute pharyngitis, unspecified: Secondary | ICD-10-CM

## 2014-04-02 DIAGNOSIS — E669 Obesity, unspecified: Secondary | ICD-10-CM

## 2014-04-02 LAB — POCT URINALYSIS DIP (DEVICE)
Bilirubin Urine: NEGATIVE
Glucose, UA: NEGATIVE mg/dL
Ketones, ur: NEGATIVE mg/dL
LEUKOCYTES UA: NEGATIVE
Nitrite: NEGATIVE
PH: 6 (ref 5.0–8.0)
PROTEIN: NEGATIVE mg/dL
Specific Gravity, Urine: 1.02 (ref 1.005–1.030)
Urobilinogen, UA: 0.2 mg/dL (ref 0.0–1.0)

## 2014-04-02 NOTE — Patient Instructions (Signed)
Third Trimester of Pregnancy The third trimester is from week 29 through week 42, months 7 through 9. The third trimester is a time when the fetus is growing rapidly. At the end of the ninth month, the fetus is about 20 inches in length and weighs 6-10 pounds.  BODY CHANGES Your body goes through many changes during pregnancy. The changes vary from woman to woman.   Your weight will continue to increase. You can expect to gain 25-35 pounds (11-16 kg) by the end of the pregnancy.  You may begin to get stretch marks on your hips, abdomen, and breasts.  You may urinate more often because the fetus is moving lower into your pelvis and pressing on your bladder.  You may develop or continue to have heartburn as a result of your pregnancy.  You may develop constipation because certain hormones are causing the muscles that push waste through your intestines to slow down.  You may develop hemorrhoids or swollen, bulging veins (varicose veins).  You may have pelvic pain because of the weight gain and pregnancy hormones relaxing your joints between the bones in your pelvis. Backaches may result from overexertion of the muscles supporting your posture.  You may have changes in your hair. These can include thickening of your hair, rapid growth, and changes in texture. Some women also have hair loss during or after pregnancy, or hair that feels dry or thin. Your hair will most likely return to normal after your baby is born.  Your breasts will continue to grow and be tender. A yellow discharge may leak from your breasts called colostrum.  Your belly button may stick out.  You may feel short of breath because of your expanding uterus.  You may notice the fetus "dropping," or moving lower in your abdomen.  You may have a bloody mucus discharge. This usually occurs a few days to a week before labor begins.  Your cervix becomes thin and soft (effaced) near your due date. WHAT TO EXPECT AT YOUR PRENATAL  EXAMS  You will have prenatal exams every 2 weeks until week 36. Then, you will have weekly prenatal exams. During a routine prenatal visit:  You will be weighed to make sure you and the fetus are growing normally.  Your blood pressure is taken.  Your abdomen will be measured to track your baby's growth.  The fetal heartbeat will be listened to.  Any test results from the previous visit will be discussed.  You may have a cervical check near your due date to see if you have effaced. At around 36 weeks, your caregiver will check your cervix. At the same time, your caregiver will also perform a test on the secretions of the vaginal tissue. This test is to determine if a type of bacteria, Group B streptococcus, is present. Your caregiver will explain this further. Your caregiver may ask you:  What your birth plan is.  How you are feeling.  If you are feeling the baby move.  If you have had any abnormal symptoms, such as leaking fluid, bleeding, severe headaches, or abdominal cramping.  If you have any questions. Other tests or screenings that may be performed during your third trimester include:  Blood tests that check for low iron levels (anemia).  Fetal testing to check the health, activity level, and growth of the fetus. Testing is done if you have certain medical conditions or if there are problems during the pregnancy. FALSE LABOR You may feel small, irregular contractions that   eventually go away. These are called Braxton Hicks contractions, or false labor. Contractions may last for hours, days, or even weeks before true labor sets in. If contractions come at regular intervals, intensify, or become painful, it is best to be seen by your caregiver.  SIGNS OF LABOR   Menstrual-like cramps.  Contractions that are 5 minutes apart or less.  Contractions that start on the top of the uterus and spread down to the lower abdomen and back.  A sense of increased pelvic pressure or back  pain.  A watery or bloody mucus discharge that comes from the vagina. If you have any of these signs before the 37th week of pregnancy, call your caregiver right away. You need to go to the hospital to get checked immediately. HOME CARE INSTRUCTIONS   Avoid all smoking, herbs, alcohol, and unprescribed drugs. These chemicals affect the formation and growth of the baby.  Follow your caregiver's instructions regarding medicine use. There are medicines that are either safe or unsafe to take during pregnancy.  Exercise only as directed by your caregiver. Experiencing uterine cramps is a good sign to stop exercising.  Continue to eat regular, healthy meals.  Wear a good support bra for breast tenderness.  Do not use hot tubs, steam rooms, or saunas.  Wear your seat belt at all times when driving.  Avoid raw meat, uncooked cheese, cat litter boxes, and soil used by cats. These carry germs that can cause birth defects in the baby.  Take your prenatal vitamins.  Try taking a stool softener (if your caregiver approves) if you develop constipation. Eat more high-fiber foods, such as fresh vegetables or fruit and whole grains. Drink plenty of fluids to keep your urine clear or pale yellow.  Take warm sitz baths to soothe any pain or discomfort caused by hemorrhoids. Use hemorrhoid cream if your caregiver approves.  If you develop varicose veins, wear support hose. Elevate your feet for 15 minutes, 3-4 times a day. Limit salt in your diet.  Avoid heavy lifting, wear low heal shoes, and practice good posture.  Rest a lot with your legs elevated if you have leg cramps or low back pain.  Visit your dentist if you have not gone during your pregnancy. Use a soft toothbrush to brush your teeth and be gentle when you floss.  A sexual relationship may be continued unless your caregiver directs you otherwise.  Do not travel far distances unless it is absolutely necessary and only with the approval  of your caregiver.  Take prenatal classes to understand, practice, and ask questions about the labor and delivery.  Make a trial run to the hospital.  Pack your hospital bag.  Prepare the baby's nursery.  Continue to go to all your prenatal visits as directed by your caregiver. SEEK MEDICAL CARE IF:  You are unsure if you are in labor or if your water has broken.  You have dizziness.  You have mild pelvic cramps, pelvic pressure, or nagging pain in your abdominal area.  You have persistent nausea, vomiting, or diarrhea.  You have a bad smelling vaginal discharge.  You have pain with urination. SEEK IMMEDIATE MEDICAL CARE IF:   You have a fever.  You are leaking fluid from your vagina.  You have spotting or bleeding from your vagina.  You have severe abdominal cramping or pain.  You have rapid weight loss or gain.  You have shortness of breath with chest pain.  You notice sudden or extreme swelling   of your face, hands, ankles, feet, or legs.  You have not felt your baby move in over an hour.  You have severe headaches that do not go away with medicine.  You have vision changes. Document Released: 08/07/2001 Document Revised: 08/18/2013 Document Reviewed: 10/14/2012 [employer] Patient Information 2015 [employer]. This information is not intended to replace advice given to you by your health care provider. Make sure you discuss any questions you have with your health care provider.  

## 2014-04-02 NOTE — Progress Notes (Signed)
30 week stable IUP, obesity She does not wish to know gender of baby but FOB was informed of the baby's gender RTC 2 weeks Pt advised regarding appt for fu U/S on 8/20 @ 9:15am.

## 2014-04-02 NOTE — Progress Notes (Signed)
No complaints today.

## 2014-04-15 ENCOUNTER — Ambulatory Visit (HOSPITAL_COMMUNITY)
Admission: RE | Admit: 2014-04-15 | Discharge: 2014-04-15 | Disposition: A | Discharge status: Home / Self Care | Payer: Medicaid Other | Source: Ambulatory Visit | Attending: Nurse Practitioner | Admitting: Nurse Practitioner

## 2014-04-15 DIAGNOSIS — O99210 Obesity complicating pregnancy, unspecified trimester: Principal | ICD-10-CM

## 2014-04-15 DIAGNOSIS — Z3689 Encounter for other specified antenatal screening: Secondary | ICD-10-CM | POA: Diagnosis not present

## 2014-04-15 DIAGNOSIS — E669 Obesity, unspecified: Secondary | ICD-10-CM | POA: Diagnosis present

## 2014-04-15 DIAGNOSIS — O99213 Obesity complicating pregnancy, third trimester: Secondary | ICD-10-CM

## 2014-04-20 ENCOUNTER — Encounter: Payer: Self-pay | Admitting: General Practice

## 2014-04-21 ENCOUNTER — Ambulatory Visit (INDEPENDENT_AMBULATORY_CARE_PROVIDER_SITE_OTHER): Payer: Medicaid Other | Admitting: Advanced Practice Midwife

## 2014-04-21 ENCOUNTER — Encounter: Payer: Self-pay | Admitting: Advanced Practice Midwife

## 2014-04-21 VITALS — BP 113/73 | HR 102 | Wt 303.2 lb

## 2014-04-21 DIAGNOSIS — O34219 Maternal care for unspecified type scar from previous cesarean delivery: Secondary | ICD-10-CM

## 2014-04-21 LAB — POCT URINALYSIS DIP (DEVICE)
Bilirubin Urine: NEGATIVE
Glucose, UA: NEGATIVE mg/dL
HGB URINE DIPSTICK: NEGATIVE
Ketones, ur: NEGATIVE mg/dL
Nitrite: NEGATIVE
PH: 5.5 (ref 5.0–8.0)
PROTEIN: NEGATIVE mg/dL
Specific Gravity, Urine: 1.025 (ref 1.005–1.030)
UROBILINOGEN UA: 0.2 mg/dL (ref 0.0–1.0)

## 2014-04-21 NOTE — Progress Notes (Signed)
Doing well. Reviewed normal f/u US, but Dr Sherrie George recommends growth [place] in 4 wks, will schedule.

## 2014-04-21 NOTE — Progress Notes (Signed)
Scheduled ultrasound for 9/16

## 2014-04-21 NOTE — Progress Notes (Signed)
Patient not interested in breast feeding

## 2014-04-21 NOTE — Patient Instructions (Signed)
Third Trimester of Pregnancy The third trimester is from week 29 through week 42, months 7 through 9. The third trimester is a time when the fetus is growing rapidly. At the end of the ninth month, the fetus is about 20 inches in length and weighs 6-10 pounds.  BODY CHANGES Your body goes through many changes during pregnancy. The changes vary from woman to woman.   Your weight will continue to increase. You can expect to gain 25-35 pounds (11-16 kg) by the end of the pregnancy.  You may begin to get stretch marks on your hips, abdomen, and breasts.  You may urinate more often because the fetus is moving lower into your pelvis and pressing on your bladder.  You may develop or continue to have heartburn as a result of your pregnancy.  You may develop constipation because certain hormones are causing the muscles that push waste through your intestines to slow down.  You may develop hemorrhoids or swollen, bulging veins (varicose veins).  You may have pelvic pain because of the weight gain and pregnancy hormones relaxing your joints between the bones in your pelvis. Backaches may result from overexertion of the muscles supporting your posture.  You may have changes in your hair. These can include thickening of your hair, rapid growth, and changes in texture. Some women also have hair loss during or after pregnancy, or hair that feels dry or thin. Your hair will most likely return to normal after your baby is born.  Your breasts will continue to grow and be tender. A yellow discharge may leak from your breasts called colostrum.  Your belly button may stick out.  You may feel short of breath because of your expanding uterus.  You may notice the fetus "dropping," or moving lower in your abdomen.  You may have a bloody mucus discharge. This usually occurs a few days to a week before labor begins.  Your cervix becomes thin and soft (effaced) near your due date. WHAT TO EXPECT AT YOUR PRENATAL  EXAMS  You will have prenatal exams every 2 weeks until week 36. Then, you will have weekly prenatal exams. During a routine prenatal visit:  You will be weighed to make sure you and the fetus are growing normally.  Your blood pressure is taken.  Your abdomen will be measured to track your baby's growth.  The fetal heartbeat will be listened to.  Any test results from the previous visit will be discussed.  You may have a cervical check near your due date to see if you have effaced. At around 36 weeks, your caregiver will check your cervix. At the same time, your caregiver will also perform a test on the secretions of the vaginal tissue. This test is to determine if a type of bacteria, Group B streptococcus, is present. Your caregiver will explain this further. Your caregiver may ask you:  What your birth plan is.  How you are feeling.  If you are feeling the baby move.  If you have had any abnormal symptoms, such as leaking fluid, bleeding, severe headaches, or abdominal cramping.  If you have any questions. Other tests or screenings that may be performed during your third trimester include:  Blood tests that check for low iron levels (anemia).  Fetal testing to check the health, activity level, and growth of the fetus. Testing is done if you have certain medical conditions or if there are problems during the pregnancy. FALSE LABOR You may feel small, irregular contractions that   eventually go away. These are called Braxton Hicks contractions, or false labor. Contractions may last for hours, days, or even weeks before true labor sets in. If contractions come at regular intervals, intensify, or become painful, it is best to be seen by your caregiver.  SIGNS OF LABOR   Menstrual-like cramps.  Contractions that are 5 minutes apart or less.  Contractions that start on the top of the uterus and spread down to the lower abdomen and back.  A sense of increased pelvic pressure or back  pain.  A watery or bloody mucus discharge that comes from the vagina. If you have any of these signs before the 37th week of pregnancy, call your caregiver right away. You need to go to the hospital to get checked immediately. HOME CARE INSTRUCTIONS   Avoid all smoking, herbs, alcohol, and unprescribed drugs. These chemicals affect the formation and growth of the baby.  Follow your caregiver's instructions regarding medicine use. There are medicines that are either safe or unsafe to take during pregnancy.  Exercise only as directed by your caregiver. Experiencing uterine cramps is a good sign to stop exercising.  Continue to eat regular, healthy meals.  Wear a good support bra for breast tenderness.  Do not use hot tubs, steam rooms, or saunas.  Wear your seat belt at all times when driving.  Avoid raw meat, uncooked cheese, cat litter boxes, and soil used by cats. These carry germs that can cause birth defects in the baby.  Take your prenatal vitamins.  Try taking a stool softener (if your caregiver approves) if you develop constipation. Eat more high-fiber foods, such as fresh vegetables or fruit and whole grains. Drink plenty of fluids to keep your urine clear or pale yellow.  Take warm sitz baths to soothe any pain or discomfort caused by hemorrhoids. Use hemorrhoid cream if your caregiver approves.  If you develop varicose veins, wear support hose. Elevate your feet for 15 minutes, 3-4 times a day. Limit salt in your diet.  Avoid heavy lifting, wear low heal shoes, and practice good posture.  Rest a lot with your legs elevated if you have leg cramps or low back pain.  Visit your dentist if you have not gone during your pregnancy. Use a soft toothbrush to brush your teeth and be gentle when you floss.  A sexual relationship may be continued unless your caregiver directs you otherwise.  Do not travel far distances unless it is absolutely necessary and only with the approval  of your caregiver.  Take prenatal classes to understand, practice, and ask questions about the labor and delivery.  Make a trial run to the hospital.  Pack your hospital bag.  Prepare the baby's nursery.  Continue to go to all your prenatal visits as directed by your caregiver. SEEK MEDICAL CARE IF:  You are unsure if you are in labor or if your water has broken.  You have dizziness.  You have mild pelvic cramps, pelvic pressure, or nagging pain in your abdominal area.  You have persistent nausea, vomiting, or diarrhea.  You have a bad smelling vaginal discharge.  You have pain with urination. SEEK IMMEDIATE MEDICAL CARE IF:   You have a fever.  You are leaking fluid from your vagina.  You have spotting or bleeding from your vagina.  You have severe abdominal cramping or pain.  You have rapid weight loss or gain.  You have shortness of breath with chest pain.  You notice sudden or extreme swelling   of your face, hands, ankles, feet, or legs.  You have not felt your baby move in over an hour.  You have severe headaches that do not go away with medicine.  You have vision changes. Document Released: 08/07/2001 Document Revised: 08/18/2013 Document Reviewed: 10/14/2012 [employer] Patient Information 2015 [employer]. This information is not intended to replace advice given to you by your health care provider. Make sure you discuss any questions you have with your health care provider.  

## 2014-05-05 ENCOUNTER — Ambulatory Visit (INDEPENDENT_AMBULATORY_CARE_PROVIDER_SITE_OTHER): Payer: Medicaid Other | Admitting: Advanced Practice Midwife

## 2014-05-05 VITALS — BP 140/87 | HR 91 | Temp 98.1°F | Wt 308.6 lb

## 2014-05-05 DIAGNOSIS — Z3493 Encounter for supervision of normal pregnancy, unspecified, third trimester: Secondary | ICD-10-CM | POA: Insufficient documentation

## 2014-05-05 DIAGNOSIS — R6889 Other general symptoms and signs: Secondary | ICD-10-CM

## 2014-05-05 DIAGNOSIS — Z23 Encounter for immunization: Secondary | ICD-10-CM

## 2014-05-05 DIAGNOSIS — O34219 Maternal care for unspecified type scar from previous cesarean delivery: Secondary | ICD-10-CM

## 2014-05-05 DIAGNOSIS — IMO0002 Reserved for concepts with insufficient information to code with codable children: Secondary | ICD-10-CM

## 2014-05-05 DIAGNOSIS — Z3480 Encounter for supervision of other normal pregnancy, unspecified trimester: Secondary | ICD-10-CM

## 2014-05-05 LAB — POCT URINALYSIS DIP (DEVICE)
Bilirubin Urine: NEGATIVE
Glucose, UA: NEGATIVE mg/dL
Hgb urine dipstick: NEGATIVE
Ketones, ur: NEGATIVE mg/dL
Leukocytes, UA: NEGATIVE
NITRITE: NEGATIVE
PH: 6 (ref 5.0–8.0)
PROTEIN: NEGATIVE mg/dL
Specific Gravity, Urine: 1.02 (ref 1.005–1.030)
Urobilinogen, UA: 0.2 mg/dL (ref 0.0–1.0)

## 2014-05-05 NOTE — Progress Notes (Signed)
Flu vaccine. Active baby.

## 2014-05-05 NOTE — Progress Notes (Signed)
Pressure-pelvic  Edema-feet/hands Flu vaccine consented and info given

## 2014-05-05 NOTE — Patient Instructions (Signed)

## 2014-05-12 ENCOUNTER — Ambulatory Visit (HOSPITAL_COMMUNITY)
Admission: RE | Admit: 2014-05-12 | Discharge: 2014-05-12 | Disposition: A | Discharge status: Home / Self Care | Payer: Medicaid Other | Source: Ambulatory Visit | Attending: Advanced Practice Midwife | Admitting: Advanced Practice Midwife

## 2014-05-12 ENCOUNTER — Encounter: Payer: Self-pay | Admitting: Advanced Practice Midwife

## 2014-05-12 ENCOUNTER — Ambulatory Visit (INDEPENDENT_AMBULATORY_CARE_PROVIDER_SITE_OTHER): Payer: Medicaid Other | Admitting: Advanced Practice Midwife

## 2014-05-12 ENCOUNTER — Other Ambulatory Visit: Payer: Self-pay | Admitting: Advanced Practice Midwife

## 2014-05-12 VITALS — BP 127/85 | HR 89 | Temp 98.2°F | Wt 313.4 lb

## 2014-05-12 DIAGNOSIS — E669 Obesity, unspecified: Secondary | ICD-10-CM | POA: Diagnosis not present

## 2014-05-12 DIAGNOSIS — O99210 Obesity complicating pregnancy, unspecified trimester: Secondary | ICD-10-CM

## 2014-05-12 DIAGNOSIS — Z3493 Encounter for supervision of normal pregnancy, unspecified, third trimester: Secondary | ICD-10-CM

## 2014-05-12 DIAGNOSIS — Z3480 Encounter for supervision of other normal pregnancy, unspecified trimester: Secondary | ICD-10-CM

## 2014-05-12 DIAGNOSIS — O34219 Maternal care for unspecified type scar from previous cesarean delivery: Secondary | ICD-10-CM | POA: Insufficient documentation

## 2014-05-12 LAB — POCT URINALYSIS DIP (DEVICE)
Bilirubin Urine: NEGATIVE
Glucose, UA: NEGATIVE mg/dL
Hgb urine dipstick: NEGATIVE
KETONES UR: NEGATIVE mg/dL
Leukocytes, UA: NEGATIVE
Nitrite: NEGATIVE
PH: 6 (ref 5.0–8.0)
PROTEIN: NEGATIVE mg/dL
Specific Gravity, Urine: 1.015 (ref 1.005–1.030)
Urobilinogen, UA: 0.2 mg/dL (ref 0.0–1.0)

## 2014-05-12 LAB — OB RESULTS CONSOLE GC/CHLAMYDIA
Chlamydia: NEGATIVE
Gonorrhea: NEGATIVE

## 2014-05-12 LAB — OB RESULTS CONSOLE GBS: GBS: NEGATIVE

## 2014-05-12 NOTE — Patient Instructions (Signed)
Braxton Hicks Contractions Contractions of the uterus can occur throughout pregnancy. Contractions are not always a sign that you are in labor.  WHAT ARE BRAXTON HICKS CONTRACTIONS?  Contractions that occur before labor are called Braxton Hicks contractions, or false labor. Toward the end of pregnancy (32-34 weeks), these contractions can develop more often and may become more forceful. This is not true labor because these contractions do not result in opening (dilatation) and thinning of the cervix. They are sometimes difficult to tell apart from true labor because these contractions can be forceful and people have different pain tolerances. You should not feel embarrassed if you go to the hospital with false labor. Sometimes, the only way to tell if you are in true labor is for your health care provider to look for changes in the cervix. If there are no prenatal problems or other health problems associated with the pregnancy, it is completely safe to be sent home with false labor and await the onset of true labor. HOW CAN YOU TELL THE DIFFERENCE BETWEEN TRUE AND FALSE LABOR? False Labor  The contractions of false labor are usually shorter and not as hard as those of true labor.   The contractions are usually irregular.   The contractions are often felt in the front of the lower abdomen and in the groin.   The contractions may go away when you walk around or change positions while lying down.   The contractions get weaker and are shorter lasting as time goes on.   The contractions do not usually become progressively stronger, regular, and closer together as with true labor.  True Labor  Contractions in true labor last 30-70 seconds, become very regular, usually become more intense, and increase in frequency.   The contractions do not go away with walking.   The discomfort is usually felt in the top of the uterus and spreads to the lower abdomen and low back.   True labor can be  determined by your health care provider with an exam. This will show that the cervix is dilating and getting thinner.  WHAT TO REMEMBER  Keep up with your usual exercises and follow other instructions given by your health care provider.   Take medicines as directed by your health care provider.   Keep your regular prenatal appointments.   Eat and drink lightly if you think you are going into labor.   If Braxton Hicks contractions are making you uncomfortable:   Change your position from lying down or resting to walking, or from walking to resting.   Sit and rest in a tub of warm water.   Drink 2-3 glasses of water. Dehydration may cause these contractions.   Do slow and deep breathing several times an hour.  WHEN SHOULD I SEEK IMMEDIATE MEDICAL CARE? Seek immediate medical care if:  Your contractions become stronger, more regular, and closer together.   You have fluid leaking or gushing from your vagina.   You have a fever.   You pass blood-tinged mucus.   You have vaginal bleeding.   You have continuous abdominal pain.   You have low back pain that you never had before.   You feel your baby's head pushing down and causing pelvic pressure.   Your baby is not moving as much as it used to.  Document Released: 08/13/2005 Document Revised: 08/18/2013 Document Reviewed: 05/25/2013 [employer] Patient Information 2015 [employer]. This information is not intended to replace advice given to you by your health care   provider. Make sure you discuss any questions you have with your health care provider.  Fetal Movement Counts Patient Name: __________________________________________________ Patient Due Date: ____________________ Performing a fetal movement count is highly recommended in high-risk pregnancies, but it is good for every pregnant woman to do. Your health care provider may ask you to start counting fetal movements at 28 weeks of the pregnancy. Fetal  movements often increase:  After eating a full meal.  After physical activity.  After eating or drinking something sweet or cold.  At rest. Pay attention to when you feel the baby is most active. This will help you notice a pattern of your baby's sleep and wake cycles and what factors contribute to an increase in fetal movement. It is important to perform a fetal movement count at the same time each day when your baby is normally most active.  HOW TO COUNT FETAL MOVEMENTS 1. Find a quiet and comfortable area to sit or lie down on your left side. Lying on your left side provides the best blood and oxygen circulation to your baby. 2. Write down the day and time on a sheet of paper or in a journal. 3. Start counting kicks, flutters, swishes, rolls, or jabs in a 2-hour period. You should feel at least 10 movements within 2 hours. 4. If you do not feel 10 movements in 2 hours, wait 2-3 hours and count again. Look for a change in the pattern or not enough counts in 2 hours. SEEK MEDICAL CARE IF:  You feel less than 10 counts in 2 hours, tried twice.  There is no movement in over an hour.  The pattern is changing or taking longer each day to reach 10 counts in 2 hours.  You feel the baby is not moving as he or she usually does. Date: ____________ Movements: ____________ Start time: ____________ Finish time: ____________  Date: ____________ Movements: ____________ Start time: ____________ Finish time: ____________ Date: ____________ Movements: ____________ Start time: ____________ Finish time: ____________ Date: ____________ Movements: ____________ Start time: ____________ Finish time: ____________ Date: ____________ Movements: ____________ Start time: ____________ Finish time: ____________ Date: ____________ Movements: ____________ Start time: ____________ Finish time: ____________ Date: ____________ Movements: ____________ Start time: ____________ Finish time: ____________ Date: ____________  Movements: ____________ Start time: ____________ Finish time: ____________  Date: ____________ Movements: ____________ Start time: ____________ Finish time: ____________ Date: ____________ Movements: ____________ Start time: ____________ Finish time: ____________ Date: ____________ Movements: ____________ Start time: ____________ Finish time: ____________ Date: ____________ Movements: ____________ Start time: ____________ Finish time: ____________ Date: ____________ Movements: ____________ Start time: ____________ Finish time: ____________ Date: ____________ Movements: ____________ Start time: ____________ Finish time: ____________ Date: ____________ Movements: ____________ Start time: ____________ Finish time: ____________  Date: ____________ Movements: ____________ Start time: ____________ Finish time: ____________ Date: ____________ Movements: ____________ Start time: ____________ Finish time: ____________ Date: ____________ Movements: ____________ Start time: ____________ Finish time: ____________ Date: ____________ Movements: ____________ Start time: ____________ Finish time: ____________ Date: ____________ Movements: ____________ Start time: ____________ Finish time: ____________ Date: ____________ Movements: ____________ Start time: ____________ Finish time: ____________ Date: ____________ Movements: ____________ Start time: ____________ Finish time: ____________  Date: ____________ Movements: ____________ Start time: ____________ Finish time: ____________ Date: ____________ Movements: ____________ Start time: ____________ Finish time: ____________ Date: ____________ Movements: ____________ Start time: ____________ Finish time: ____________ Date: ____________ Movements: ____________ Start time: ____________ Finish time: ____________ Date: ____________ Movements: ____________ Start time: ____________ Finish time: ____________ Date: ____________ Movements: ____________ Start time:  ____________ Finish time: ____________ Date: ____________ Movements:   ____________ Start time: ____________ Finish time: ____________  Date: ____________ Movements: ____________ Start time: ____________ Finish time: ____________ Date: ____________ Movements: ____________ Start time: ____________ Finish time: ____________ Date: ____________ Movements: ____________ Start time: ____________ Finish time: ____________ Date: ____________ Movements: ____________ Start time: ____________ Finish time: ____________ Date: ____________ Movements: ____________ Start time: ____________ Finish time: ____________ Date: ____________ Movements: ____________ Start time: ____________ Finish time: ____________ Date: ____________ Movements: ____________ Start time: ____________ Finish time: ____________  Date: ____________ Movements: ____________ Start time: ____________ Finish time: ____________ Date: ____________ Movements: ____________ Start time: ____________ Finish time: ____________ Date: ____________ Movements: ____________ Start time: ____________ Finish time: ____________ Date: ____________ Movements: ____________ Start time: ____________ Finish time: ____________ Date: ____________ Movements: ____________ Start time: ____________ Finish time: ____________ Date: ____________ Movements: ____________ Start time: ____________ Finish time: ____________ Date: ____________ Movements: ____________ Start time: ____________ Finish time: ____________  Date: ____________ Movements: ____________ Start time: ____________ Finish time: ____________ Date: ____________ Movements: ____________ Start time: ____________ Finish time: ____________ Date: ____________ Movements: ____________ Start time: ____________ Finish time: ____________ Date: ____________ Movements: ____________ Start time: ____________ Finish time: ____________ Date: ____________ Movements: ____________ Start time: ____________ Finish time: ____________ Date:  ____________ Movements: ____________ Start time: ____________ Finish time: ____________ Date: ____________ Movements: ____________ Start time: ____________ Finish time: ____________  Date: ____________ Movements: ____________ Start time: ____________ Finish time: ____________ Date: ____________ Movements: ____________ Start time: ____________ Finish time: ____________ Date: ____________ Movements: ____________ Start time: ____________ Finish time: ____________ Date: ____________ Movements: ____________ Start time: ____________ Finish time: ____________ Date: ____________ Movements: ____________ Start time: ____________ Finish time: ____________ Date: ____________ Movements: ____________ Start time: ____________ Finish time: ____________ Document Released: 09/12/2006 Document Revised: 12/28/2013 Document Reviewed: 06/09/2012 [employer] Patient Information 2015 [employer]. This information is not intended to replace advice given to you by your health care provider. Make sure you discuss any questions you have with your health care provider.  

## 2014-05-12 NOTE — Progress Notes (Signed)
Patient reports pelvic pressure and cramping when baby moves a lot

## 2014-05-12 NOTE — Progress Notes (Signed)
Growth [place] prelim EFW 6-12, (82%), AC >97%. GSB, GC/Chl.

## 2014-05-13 LAB — GC/CHLAMYDIA PROBE AMP
CT Probe RNA: NEGATIVE
GC Probe RNA: NEGATIVE

## 2014-05-14 LAB — CULTURE, BETA STREP (GROUP B ONLY)

## 2014-05-18 ENCOUNTER — Encounter: Payer: Self-pay | Admitting: Advanced Practice Midwife

## 2014-05-19 ENCOUNTER — Ambulatory Visit (INDEPENDENT_AMBULATORY_CARE_PROVIDER_SITE_OTHER): Payer: Medicaid Other | Admitting: Physician Assistant

## 2014-05-19 VITALS — BP 134/78 | HR 97 | Temp 97.6°F | Wt 312.7 lb

## 2014-05-19 DIAGNOSIS — Z3480 Encounter for supervision of other normal pregnancy, unspecified trimester: Secondary | ICD-10-CM

## 2014-05-19 DIAGNOSIS — Z3483 Encounter for supervision of other normal pregnancy, third trimester: Secondary | ICD-10-CM

## 2014-05-19 LAB — POCT URINALYSIS DIP (DEVICE)
BILIRUBIN URINE: NEGATIVE
Glucose, UA: NEGATIVE mg/dL
HGB URINE DIPSTICK: NEGATIVE
KETONES UR: NEGATIVE mg/dL
Leukocytes, UA: NEGATIVE
Nitrite: NEGATIVE
PH: 6 (ref 5.0–8.0)
Protein, ur: NEGATIVE mg/dL
Specific Gravity, Urine: 1.015 (ref 1.005–1.030)
Urobilinogen, UA: 0.2 mg/dL (ref 0.0–1.0)

## 2014-05-19 NOTE — Progress Notes (Signed)
36 weeks, no complaints, no bleeding/discharge/ctx Cultures done last visit.  U/S from 9/16 reviewed.  Labor precautions reviewed. RTC 1 week.

## 2014-05-19 NOTE — Patient Instructions (Signed)
Breastfeeding Deciding to breastfeed is one of the best choices you can make for you and your baby. A change in hormones during pregnancy causes your breast tissue to grow and increases the number and size of your milk ducts. These hormones also allow proteins, sugars, and fats from your blood supply to make breast milk in your milk-producing glands. Hormones prevent breast milk from being released before your baby is born as well as prompt milk flow after birth. Once breastfeeding has begun, thoughts of your baby, as well as his or her sucking or crying, can stimulate the release of milk from your milk-producing glands.  BENEFITS OF BREASTFEEDING For Your Baby  Your first milk (colostrum) helps your baby's digestive system function better.   There are antibodies in your milk that help your baby fight off infections.   Your baby has a lower incidence of asthma, allergies, and sudden infant death syndrome.   The nutrients in breast milk are better for your baby than infant formulas and are designed uniquely for your baby's needs.   Breast milk improves your baby's brain development.   Your baby is less likely to develop other conditions, such as childhood obesity, asthma, or type 2 diabetes mellitus.  For You   Breastfeeding helps to create a very special bond between you and your baby.   Breastfeeding is convenient. Breast milk is always available at the correct temperature and costs nothing.   Breastfeeding helps to burn calories and helps you lose the weight gained during pregnancy.   Breastfeeding makes your uterus contract to its prepregnancy size faster and slows bleeding (lochia) after you give birth.   Breastfeeding helps to lower your risk of developing type 2 diabetes mellitus, osteoporosis, and breast or ovarian cancer later in life. SIGNS THAT YOUR BABY IS HUNGRY Early Signs of Hunger  Increased alertness or activity.  Stretching.  Movement of the head from  side to side.  Movement of the head and opening of the mouth when the corner of the mouth or cheek is stroked (rooting).  Increased sucking sounds, smacking lips, cooing, sighing, or squeaking.  Hand-to-mouth movements.  Increased sucking of fingers or hands. Late Signs of Hunger  Fussing.  Intermittent crying. Extreme Signs of Hunger Signs of extreme hunger will require calming and consoling before your baby will be able to breastfeed successfully. Do not wait for the following signs of extreme hunger to occur before you initiate breastfeeding:   Restlessness.  A loud, strong cry.   Screaming. BREASTFEEDING BASICS Breastfeeding Initiation  Find a comfortable place to sit or lie down, with your neck and back well supported.  Place a pillow or rolled up blanket under your baby to bring him or her to the level of your breast (if you are seated). Nursing pillows are specially designed to help support your arms and your baby while you breastfeed.  Make sure that your baby's abdomen is facing your abdomen.   Gently massage your breast. With your fingertips, massage from your chest wall toward your nipple in a circular motion. This encourages milk flow. You may need to continue this action during the feeding if your milk flows slowly.  Support your breast with 4 fingers underneath and your thumb above your nipple. Make sure your fingers are well away from your nipple and your baby's mouth.   Stroke your baby's lips gently with your finger or nipple.   When your baby's mouth is open wide enough, quickly bring your baby to your   breast, placing your entire nipple and as much of the colored area around your nipple (areola) as possible into your baby's mouth.   More areola should be visible above your baby's upper lip than below the lower lip.   Your baby's tongue should be between his or her lower gum and your breast.   Ensure that your baby's mouth is correctly positioned  around your nipple (latched). Your baby's lips should create a seal on your breast and be turned out (everted).  It is common for your baby to suck about 2-3 minutes in order to start the flow of breast milk. Latching Teaching your baby how to latch on to your breast properly is very important. An improper latch can cause nipple pain and decreased milk supply for you and poor weight gain in your baby. Also, if your baby is not latched onto your nipple properly, he or she may swallow some air during feeding. This can make your baby fussy. Burping your baby when you switch breasts during the feeding can help to get rid of the air. However, teaching your baby to latch on properly is still the best way to prevent fussiness from swallowing air while breastfeeding. Signs that your baby has successfully latched on to your nipple:    Silent tugging or silent sucking, without causing you pain.   Swallowing heard between every 3-4 sucks.    Muscle movement above and in front of his or her ears while sucking.  Signs that your baby has not successfully latched on to nipple:   Sucking sounds or smacking sounds from your baby while breastfeeding.  Nipple pain. If you think your baby has not latched on correctly, slip your finger into the corner of your baby's mouth to break the suction and place it between your baby's gums. Attempt breastfeeding initiation again. Signs of Successful Breastfeeding Signs from your baby:   A gradual decrease in the number of sucks or complete cessation of sucking.   Falling asleep.   Relaxation of his or her body.   Retention of a small amount of milk in his or her mouth.   Letting go of your breast by himself or herself. Signs from you:  Breasts that have increased in firmness, weight, and size 1-3 hours after feeding.   Breasts that are softer immediately after breastfeeding.  Increased milk volume, as well as a change in milk consistency and color by  the fifth day of breastfeeding.   Nipples that are not sore, cracked, or bleeding. Signs That Your Baby is Getting Enough Milk  Wetting at least 3 diapers in a 24-hour period. The urine should be clear and pale yellow by age 5 days.  At least 3 stools in a 24-hour period by age 5 days. The stool should be soft and yellow.  At least 3 stools in a 24-hour period by age 7 days. The stool should be seedy and yellow.  No loss of weight greater than 10% of birth weight during the first 3 days of age.  Average weight gain of 4-7 ounces (113-198 g) per week after age 4 days.  Consistent daily weight gain by age 5 days, without weight loss after the age of 2 weeks. After a feeding, your baby may spit up a small amount. This is common. BREASTFEEDING FREQUENCY AND DURATION Frequent feeding will help you make more milk and can prevent sore nipples and breast engorgement. Breastfeed when you feel the need to reduce the fullness of your breasts   or when your baby shows signs of hunger. This is called "breastfeeding on demand." Avoid introducing a pacifier to your baby while you are working to establish breastfeeding (the first 4-6 weeks after your baby is born). After this time you may choose to use a pacifier. Research has shown that pacifier use during the first year of a baby's life decreases the risk of sudden infant death syndrome (SIDS). Allow your baby to feed on each breast as long as he or she wants. Breastfeed until your baby is finished feeding. When your baby unlatches or falls asleep while feeding from the first breast, offer the second breast. Because newborns are often sleepy in the first few weeks of life, you may need to awaken your baby to get him or her to feed. Breastfeeding times will vary from baby to baby. However, the following rules can serve as a guide to help you ensure that your baby is properly fed:  Newborns (babies 4 weeks of age or younger) may breastfeed every 1-3  hours.  Newborns should not go longer than 3 hours during the day or 5 hours during the night without breastfeeding.  You should breastfeed your baby a minimum of 8 times in a 24-hour period until you begin to introduce solid foods to your baby at around 6 months of age. BREAST MILK PUMPING Pumping and storing breast milk allows you to ensure that your baby is exclusively fed your breast milk, even at times when you are unable to breastfeed. This is especially important if you are going back to work while you are still breastfeeding or when you are not able to be present during feedings. Your lactation consultant can give you guidelines on how long it is safe to store breast milk.  A breast pump is a machine that allows you to pump milk from your breast into a sterile bottle. The pumped breast milk can then be stored in a refrigerator or freezer. Some breast pumps are operated by hand, while others use electricity. Ask your lactation consultant which type will work best for you. Breast pumps can be purchased, but some hospitals and breastfeeding support groups lease breast pumps on a monthly basis. A lactation consultant can teach you how to hand express breast milk, if you prefer not to use a pump.  CARING FOR YOUR BREASTS WHILE YOU BREASTFEED Nipples can become dry, cracked, and sore while breastfeeding. The following recommendations can help keep your breasts moisturized and healthy:  Avoid using soap on your nipples.   Wear a supportive bra. Although not required, special nursing bras and tank tops are designed to allow access to your breasts for breastfeeding without taking off your entire bra or top. Avoid wearing underwire-style bras or extremely tight bras.  Air dry your nipples for 3-4minutes after each feeding.   Use only cotton bra pads to absorb leaked breast milk. Leaking of breast milk between feedings is normal.   Use lanolin on your nipples after breastfeeding. Lanolin helps to  maintain your skin's normal moisture barrier. If you use pure lanolin, you do not need to wash it off before feeding your baby again. Pure lanolin is not toxic to your baby. You may also hand express a few drops of breast milk and gently massage that milk into your nipples and allow the milk to air dry. In the first few weeks after giving birth, some women experience extremely full breasts (engorgement). Engorgement can make your breasts feel heavy, warm, and tender to the   touch. Engorgement peaks within 3-5 days after you give birth. The following recommendations can help ease engorgement:  Completely empty your breasts while breastfeeding or pumping. You may want to start by applying warm, moist heat (in the shower or with warm water-soaked hand towels) just before feeding or pumping. This increases circulation and helps the milk flow. If your baby does not completely empty your breasts while breastfeeding, pump any extra milk after he or she is finished.  Wear a snug bra (nursing or regular) or tank top for 1-2 days to signal your body to slightly decrease milk production.  Apply ice packs to your breasts, unless this is too uncomfortable for you.  Make sure that your baby is latched on and positioned properly while breastfeeding. If engorgement persists after 48 hours of following these recommendations, contact your health care provider or a lactation consultant. OVERALL HEALTH CARE RECOMMENDATIONS WHILE BREASTFEEDING  Eat healthy foods. Alternate between meals and snacks, eating 3 of each per day. Because what you eat affects your breast milk, some of the foods may make your baby more irritable than usual. Avoid eating these foods if you are sure that they are negatively affecting your baby.  Drink milk, fruit juice, and water to satisfy your thirst (about 10 glasses a day).   Rest often, relax, and continue to take your prenatal vitamins to prevent fatigue, stress, and anemia.  Continue  breast self-awareness checks.  Avoid chewing and smoking tobacco.  Avoid alcohol and drug use. Some medicines that may be harmful to your baby can pass through breast milk. It is important to ask your health care provider before taking any medicine, including all over-the-counter and prescription medicine as well as vitamin and herbal supplements. It is possible to become pregnant while breastfeeding. If birth control is desired, ask your health care provider about options that will be safe for your baby. SEEK MEDICAL CARE IF:   You feel like you want to stop breastfeeding or have become frustrated with breastfeeding.  You have painful breasts or nipples.  Your nipples are cracked or bleeding.  Your breasts are red, tender, or warm.  You have a swollen area on either breast.  You have a fever or chills.  You have nausea or vomiting.  You have drainage other than breast milk from your nipples.  Your breasts do not become full before feedings by the fifth day after you give birth.  You feel sad and depressed.  Your baby is too sleepy to eat well.  Your baby is having trouble sleeping.   Your baby is wetting less than 3 diapers in a 24-hour period.  Your baby has less than 3 stools in a 24-hour period.  Your baby's skin or the white part of his or her eyes becomes yellow.   Your baby is not gaining weight by 5 days of age. SEEK IMMEDIATE MEDICAL CARE IF:   Your baby is overly tired (lethargic) and does not want to wake up and feed.  Your baby develops an unexplained fever. Document Released: 08/13/2005 Document Revised: 08/18/2013 Document Reviewed: 02/04/2013 [employer] Patient Information 2015 [employer]. This information is not intended to replace advice given to you by your health care provider. Make sure you discuss any questions you have with your health care provider.  

## 2014-05-26 ENCOUNTER — Ambulatory Visit (INDEPENDENT_AMBULATORY_CARE_PROVIDER_SITE_OTHER): Payer: Medicaid Other | Admitting: Advanced Practice Midwife

## 2014-05-26 VITALS — BP 121/75 | HR 95 | Wt 313.1 lb

## 2014-05-26 DIAGNOSIS — Z3480 Encounter for supervision of other normal pregnancy, unspecified trimester: Secondary | ICD-10-CM

## 2014-05-26 DIAGNOSIS — Z3483 Encounter for supervision of other normal pregnancy, third trimester: Secondary | ICD-10-CM

## 2014-05-26 LAB — POCT URINALYSIS DIP (DEVICE)
Bilirubin Urine: NEGATIVE
Glucose, UA: NEGATIVE mg/dL
Hgb urine dipstick: NEGATIVE
Ketones, ur: NEGATIVE mg/dL
Leukocytes, UA: NEGATIVE
Nitrite: NEGATIVE
PH: 6 (ref 5.0–8.0)
PROTEIN: NEGATIVE mg/dL
SPECIFIC GRAVITY, URINE: 1.02 (ref 1.005–1.030)
Urobilinogen, UA: 0.2 mg/dL (ref 0.0–1.0)

## 2014-05-26 NOTE — Progress Notes (Signed)
Cultures all negative.Labor precautions reviewed.

## 2014-05-26 NOTE — Patient Instructions (Signed)

## 2014-05-31 ENCOUNTER — Encounter: Payer: Medicaid Other | Admitting: Family Medicine

## 2014-06-01 ENCOUNTER — Inpatient Hospital Stay (HOSPITAL_COMMUNITY)
Admission: AD | Admit: 2014-06-01 | Discharge: 2014-06-04 | DRG: 765 | Disposition: A | Discharge status: Home / Self Care | Payer: Medicaid Other | Source: Ambulatory Visit | Attending: Obstetrics and Gynecology | Admitting: Obstetrics and Gynecology

## 2014-06-01 DIAGNOSIS — Z3493 Encounter for supervision of normal pregnancy, unspecified, third trimester: Secondary | ICD-10-CM

## 2014-06-01 DIAGNOSIS — Z6841 Body Mass Index (BMI) 40.0 and over, adult: Secondary | ICD-10-CM

## 2014-06-01 DIAGNOSIS — O4290 Premature rupture of membranes, unspecified as to length of time between rupture and onset of labor, unspecified weeks of gestation: Secondary | ICD-10-CM | POA: Diagnosis present

## 2014-06-01 DIAGNOSIS — Z823 Family history of stroke: Secondary | ICD-10-CM

## 2014-06-01 DIAGNOSIS — O99214 Obesity complicating childbirth: Secondary | ICD-10-CM | POA: Diagnosis present

## 2014-06-01 DIAGNOSIS — Z3A38 38 weeks gestation of pregnancy: Secondary | ICD-10-CM | POA: Diagnosis present

## 2014-06-01 DIAGNOSIS — Z833 Family history of diabetes mellitus: Secondary | ICD-10-CM

## 2014-06-01 DIAGNOSIS — O339 Maternal care for disproportion, unspecified: Secondary | ICD-10-CM

## 2014-06-01 DIAGNOSIS — O4292 Full-term premature rupture of membranes, unspecified as to length of time between rupture and onset of labor: Secondary | ICD-10-CM | POA: Diagnosis present

## 2014-06-01 DIAGNOSIS — O3421 Maternal care for scar from previous cesarean delivery: Principal | ICD-10-CM | POA: Diagnosis present

## 2014-06-01 DIAGNOSIS — O34219 Maternal care for unspecified type scar from previous cesarean delivery: Secondary | ICD-10-CM

## 2014-06-01 HISTORY — DX: Nausea with vomiting, unspecified: R11.2

## 2014-06-01 HISTORY — DX: Other specified health status: Z78.9

## 2014-06-01 HISTORY — DX: Other specified postprocedural states: Z98.890

## 2014-06-01 NOTE — MAU Note (Signed)
Contractions. Thinks SROM 1045pm. Clear fluid. +FM.

## 2014-06-02 ENCOUNTER — Encounter: Payer: Medicaid Other | Admitting: Obstetrics and Gynecology

## 2014-06-02 ENCOUNTER — Encounter (HOSPITAL_COMMUNITY)
Admission: AD | Disposition: A | Discharge status: Home / Self Care | Payer: Self-pay | Source: Ambulatory Visit | Attending: Obstetrics and Gynecology

## 2014-06-02 ENCOUNTER — Encounter (HOSPITAL_COMMUNITY): Payer: Self-pay

## 2014-06-02 ENCOUNTER — Encounter (HOSPITAL_COMMUNITY): Payer: Medicaid Other | Admitting: Anesthesiology

## 2014-06-02 ENCOUNTER — Encounter: Payer: Self-pay | Admitting: Obstetrics & Gynecology

## 2014-06-02 ENCOUNTER — Inpatient Hospital Stay (HOSPITAL_COMMUNITY): Payer: Medicaid Other | Admitting: Anesthesiology

## 2014-06-02 DIAGNOSIS — Z6841 Body Mass Index (BMI) 40.0 and over, adult: Secondary | ICD-10-CM | POA: Diagnosis not present

## 2014-06-02 DIAGNOSIS — Z823 Family history of stroke: Secondary | ICD-10-CM | POA: Diagnosis not present

## 2014-06-02 DIAGNOSIS — O4290 Premature rupture of membranes, unspecified as to length of time between rupture and onset of labor, unspecified weeks of gestation: Secondary | ICD-10-CM | POA: Diagnosis present

## 2014-06-02 DIAGNOSIS — O3421 Maternal care for scar from previous cesarean delivery: Secondary | ICD-10-CM | POA: Diagnosis present

## 2014-06-02 DIAGNOSIS — Z833 Family history of diabetes mellitus: Secondary | ICD-10-CM | POA: Diagnosis not present

## 2014-06-02 DIAGNOSIS — O339 Maternal care for disproportion, unspecified: Secondary | ICD-10-CM

## 2014-06-02 DIAGNOSIS — O99214 Obesity complicating childbirth: Secondary | ICD-10-CM | POA: Diagnosis present

## 2014-06-02 DIAGNOSIS — Z3A38 38 weeks gestation of pregnancy: Secondary | ICD-10-CM | POA: Diagnosis present

## 2014-06-02 DIAGNOSIS — O4292 Full-term premature rupture of membranes, unspecified as to length of time between rupture and onset of labor: Secondary | ICD-10-CM | POA: Diagnosis present

## 2014-06-02 LAB — TYPE AND SCREEN
ABO/RH(D): A POS
Antibody Screen: NEGATIVE

## 2014-06-02 LAB — ABO/RH: ABO/RH(D): A POS

## 2014-06-02 LAB — CBC
HCT: 34.5 % — ABNORMAL LOW (ref 36.0–46.0)
HEMOGLOBIN: 11.8 g/dL — AB (ref 12.0–15.0)
MCH: 28.4 pg (ref 26.0–34.0)
MCHC: 34.2 g/dL (ref 30.0–36.0)
MCV: 83.1 fL (ref 78.0–100.0)
Platelets: 188 10*3/uL (ref 150–400)
RBC: 4.15 MIL/uL (ref 3.87–5.11)
RDW: 15.3 % (ref 11.5–15.5)
WBC: 9.5 10*3/uL (ref 4.0–10.5)

## 2014-06-02 LAB — HIV ANTIBODY (ROUTINE TESTING W REFLEX): HIV: NONREACTIVE

## 2014-06-02 LAB — RPR

## 2014-06-02 LAB — POCT FERN TEST: POCT FERN TEST: POSITIVE

## 2014-06-02 SURGERY — Surgical Case
Anesthesia: Epidural | Site: Abdomen

## 2014-06-02 MED ORDER — PHENYLEPHRINE 40 MCG/ML (10ML) SYRINGE FOR IV PUSH (FOR BLOOD PRESSURE SUPPORT)
PREFILLED_SYRINGE | INTRAVENOUS | Status: AC
  Filled 2014-06-02: qty 10

## 2014-06-02 MED ORDER — NALBUPHINE HCL 10 MG/ML IJ SOLN: 5.0000 mg | Freq: Once | INTRAMUSCULAR | Status: AC | PRN

## 2014-06-02 MED ORDER — ACETAMINOPHEN 325 MG PO TABS: 650.0000 mg | ORAL_TABLET | ORAL | Status: DC | PRN

## 2014-06-02 MED ORDER — TETANUS-DIPHTH-ACELL PERTUSSIS 5-2.5-18.5 LF-MCG/0.5 IM SUSP: 0.5000 mL | Freq: Once | INTRAMUSCULAR | Status: DC

## 2014-06-02 MED ORDER — ONDANSETRON HCL 4 MG/2ML IJ SOLN
4.0000 mg | INTRAMUSCULAR | Status: DC | PRN
  Administered 2014-06-02: 4 mg via INTRAVENOUS
  Filled 2014-06-02: qty 2

## 2014-06-02 MED ORDER — FENTANYL CITRATE 0.05 MG/ML IJ SOLN
100.0000 ug | INTRAMUSCULAR | Status: DC | PRN
  Administered 2014-06-02 (×2): 100 ug via INTRAVENOUS
  Filled 2014-06-02 (×2): qty 2

## 2014-06-02 MED ORDER — MEPERIDINE HCL 25 MG/ML IJ SOLN: 6.2500 mg | INTRAMUSCULAR | Status: DC | PRN

## 2014-06-02 MED ORDER — KETOROLAC TROMETHAMINE 30 MG/ML IJ SOLN
30.0000 mg | Freq: Four times a day (QID) | INTRAMUSCULAR | Status: AC | PRN
  Administered 2014-06-03: 30 mg via INTRAVENOUS

## 2014-06-02 MED ORDER — OXYTOCIN 10 UNIT/ML IJ SOLN
INTRAMUSCULAR | Status: AC
  Filled 2014-06-02: qty 4

## 2014-06-02 MED ORDER — OXYCODONE-ACETAMINOPHEN 5-325 MG PO TABS: 2.0000 | ORAL_TABLET | ORAL | Status: DC | PRN

## 2014-06-02 MED ORDER — PHENYLEPHRINE HCL 10 MG/ML IJ SOLN
INTRAMUSCULAR | Status: DC | PRN
Start: 2014-06-02 — End: 2014-06-02
  Administered 2014-06-02: 40 ug via INTRAVENOUS
  Administered 2014-06-02: 120 ug via INTRAVENOUS
  Administered 2014-06-02: 80 ug via INTRAVENOUS
  Administered 2014-06-02 (×2): 40 ug via INTRAVENOUS

## 2014-06-02 MED ORDER — METOCLOPRAMIDE HCL 5 MG/ML IJ SOLN
10.0000 mg | Freq: Once | INTRAMUSCULAR | Status: AC | PRN
  Administered 2014-06-02: 10 mg via INTRAVENOUS

## 2014-06-02 MED ORDER — OXYCODONE-ACETAMINOPHEN 5-325 MG PO TABS: 1.0000 | ORAL_TABLET | ORAL | Status: DC | PRN

## 2014-06-02 MED ORDER — ONDANSETRON HCL 4 MG/2ML IJ SOLN
INTRAMUSCULAR | Status: DC | PRN
  Administered 2014-06-02: 4 mg via INTRAVENOUS

## 2014-06-02 MED ORDER — CITRIC ACID-SODIUM CITRATE 334-500 MG/5ML PO SOLN
30.0000 mL | ORAL | Status: DC | PRN
  Administered 2014-06-02: 30 mL via ORAL
  Filled 2014-06-02: qty 15

## 2014-06-02 MED ORDER — MORPHINE SULFATE 0.5 MG/ML IJ SOLN
INTRAMUSCULAR | Status: AC
  Filled 2014-06-02: qty 10

## 2014-06-02 MED ORDER — SIMETHICONE 80 MG PO CHEW
80.0000 mg | CHEWABLE_TABLET | ORAL | Status: DC
  Administered 2014-06-04: 80 mg via ORAL
  Filled 2014-06-02: qty 1

## 2014-06-02 MED ORDER — NALOXONE HCL 0.4 MG/ML IJ SOLN: 0.4000 mg | INTRAMUSCULAR | Status: DC | PRN

## 2014-06-02 MED ORDER — LANOLIN HYDROUS EX OINT: 1.0000 "application " | TOPICAL_OINTMENT | CUTANEOUS | Status: DC | PRN

## 2014-06-02 MED ORDER — MENTHOL 3 MG MT LOZG: 1.0000 | LOZENGE | OROMUCOSAL | Status: DC | PRN

## 2014-06-02 MED ORDER — DIPHENHYDRAMINE HCL 50 MG/ML IJ SOLN: 12.5000 mg | INTRAMUSCULAR | Status: DC | PRN

## 2014-06-02 MED ORDER — OXYCODONE-ACETAMINOPHEN 5-325 MG PO TABS
1.0000 | ORAL_TABLET | ORAL | Status: DC | PRN
Start: 2014-06-02 — End: 2014-06-04

## 2014-06-02 MED ORDER — TERBUTALINE SULFATE 1 MG/ML IJ SOLN: 0.2500 mg | Freq: Once | INTRAMUSCULAR | Status: DC | PRN

## 2014-06-02 MED ORDER — FENTANYL 2.5 MCG/ML BUPIVACAINE 1/10 % EPIDURAL INFUSION (WH - ANES)
INTRAMUSCULAR | Status: AC
  Administered 2014-06-02: 14 mL/h via EPIDURAL
  Filled 2014-06-02: qty 125

## 2014-06-02 MED ORDER — OXYTOCIN 40 UNITS IN LACTATED RINGERS INFUSION - SIMPLE MED: 62.5000 mL/h | INTRAVENOUS | Status: AC

## 2014-06-02 MED ORDER — PRENATAL MULTIVITAMIN CH
1.0000 | ORAL_TABLET | Freq: Every day | ORAL | Status: DC
  Administered 2014-06-04: 1 via ORAL
  Filled 2014-06-02: qty 1

## 2014-06-02 MED ORDER — LACTATED RINGERS IV SOLN: 500.0000 mL | Freq: Once | INTRAVENOUS | Status: DC

## 2014-06-02 MED ORDER — DIBUCAINE 1 % RE OINT: 1.0000 "application " | TOPICAL_OINTMENT | RECTAL | Status: DC | PRN

## 2014-06-02 MED ORDER — METOCLOPRAMIDE HCL 5 MG/ML IJ SOLN
INTRAMUSCULAR | Status: AC
  Filled 2014-06-02: qty 2

## 2014-06-02 MED ORDER — SCOPOLAMINE 1 MG/3DAYS TD PT72
MEDICATED_PATCH | TRANSDERMAL | Status: AC
  Administered 2014-06-02: 1.5 mg
  Filled 2014-06-02: qty 1

## 2014-06-02 MED ORDER — MORPHINE SULFATE (PF) 0.5 MG/ML IJ SOLN
INTRAMUSCULAR | Status: DC | PRN
  Administered 2014-06-02: 4 mg via EPIDURAL

## 2014-06-02 MED ORDER — CEFAZOLIN SODIUM-DEXTROSE 2-3 GM-% IV SOLR
INTRAVENOUS | Status: AC
  Filled 2014-06-02: qty 100

## 2014-06-02 MED ORDER — LACTATED RINGERS IV SOLN
INTRAVENOUS | Status: DC | PRN
  Administered 2014-06-02 (×2): via INTRAVENOUS

## 2014-06-02 MED ORDER — PHENYLEPHRINE 40 MCG/ML (10ML) SYRINGE FOR IV PUSH (FOR BLOOD PRESSURE SUPPORT): 80.0000 ug | PREFILLED_SYRINGE | INTRAVENOUS | Status: DC | PRN

## 2014-06-02 MED ORDER — FENTANYL CITRATE 0.05 MG/ML IJ SOLN: 25.0000 ug | INTRAMUSCULAR | Status: DC | PRN

## 2014-06-02 MED ORDER — ZOLPIDEM TARTRATE 5 MG PO TABS: 5.0000 mg | ORAL_TABLET | Freq: Every evening | ORAL | Status: DC | PRN

## 2014-06-02 MED ORDER — SODIUM BICARBONATE 8.4 % IV SOLN
INTRAVENOUS | Status: AC
  Filled 2014-06-02: qty 50

## 2014-06-02 MED ORDER — SODIUM BICARBONATE 8.4 % IV SOLN
INTRAVENOUS | Status: DC | PRN
  Administered 2014-06-02 (×2): 5 mL via EPIDURAL

## 2014-06-02 MED ORDER — LACTATED RINGERS IV SOLN: 500.0000 mL | INTRAVENOUS | Status: DC | PRN

## 2014-06-02 MED ORDER — OXYTOCIN BOLUS FROM INFUSION: 500.0000 mL | INTRAVENOUS | Status: DC

## 2014-06-02 MED ORDER — NALBUPHINE HCL 10 MG/ML IJ SOLN: 5.0000 mg | INTRAMUSCULAR | Status: DC | PRN

## 2014-06-02 MED ORDER — FENTANYL 2.5 MCG/ML BUPIVACAINE 1/10 % EPIDURAL INFUSION (WH - ANES)
14.0000 mL/h | INTRAMUSCULAR | Status: DC | PRN
  Administered 2014-06-02: 14 mL/h via EPIDURAL

## 2014-06-02 MED ORDER — SIMETHICONE 80 MG PO CHEW
80.0000 mg | CHEWABLE_TABLET | ORAL | Status: DC | PRN
  Administered 2014-06-04: 80 mg via ORAL

## 2014-06-02 MED ORDER — SCOPOLAMINE 1 MG/3DAYS TD PT72
1.0000 | MEDICATED_PATCH | Freq: Once | TRANSDERMAL | Status: DC
  Filled 2014-06-02: qty 1

## 2014-06-02 MED ORDER — KETOROLAC TROMETHAMINE 30 MG/ML IJ SOLN
15.0000 mg | Freq: Once | INTRAMUSCULAR | Status: AC | PRN
  Administered 2014-06-02: 30 mg via INTRAVENOUS

## 2014-06-02 MED ORDER — CEFAZOLIN SODIUM-DEXTROSE 2-3 GM-% IV SOLR
INTRAVENOUS | Status: DC | PRN
  Administered 2014-06-02: 3 g via INTRAVENOUS

## 2014-06-02 MED ORDER — SODIUM CHLORIDE 0.9 % IJ SOLN: 3.0000 mL | INTRAMUSCULAR | Status: DC | PRN

## 2014-06-02 MED ORDER — DEXTROSE 5 % IV SOLN
INTRAVENOUS | Status: AC
  Filled 2014-06-02: qty 3000

## 2014-06-02 MED ORDER — ONDANSETRON HCL 4 MG/2ML IJ SOLN: 4.0000 mg | Freq: Three times a day (TID) | INTRAMUSCULAR | Status: DC | PRN

## 2014-06-02 MED ORDER — KETOROLAC TROMETHAMINE 30 MG/ML IJ SOLN
30.0000 mg | Freq: Four times a day (QID) | INTRAMUSCULAR | Status: AC | PRN
  Filled 2014-06-02: qty 1

## 2014-06-02 MED ORDER — IBUPROFEN 600 MG PO TABS
600.0000 mg | ORAL_TABLET | Freq: Four times a day (QID) | ORAL | Status: DC
  Administered 2014-06-03 – 2014-06-04 (×5): 600 mg via ORAL
  Filled 2014-06-02 (×5): qty 1

## 2014-06-02 MED ORDER — MORPHINE SULFATE (PF) 0.5 MG/ML IJ SOLN
INTRAMUSCULAR | Status: DC | PRN
  Administered 2014-06-02: 1 mg via INTRAVENOUS

## 2014-06-02 MED ORDER — EPHEDRINE 5 MG/ML INJ: 10.0000 mg | INTRAVENOUS | Status: DC | PRN

## 2014-06-02 MED ORDER — LACTATED RINGERS IV SOLN: INTRAVENOUS | Status: DC

## 2014-06-02 MED ORDER — ONDANSETRON HCL 4 MG PO TABS: 4.0000 mg | ORAL_TABLET | ORAL | Status: DC | PRN

## 2014-06-02 MED ORDER — 0.9 % SODIUM CHLORIDE (POUR BTL) OPTIME
TOPICAL | Status: DC | PRN
  Administered 2014-06-02: 1000 mL

## 2014-06-02 MED ORDER — WITCH HAZEL-GLYCERIN EX PADS: 1.0000 "application " | MEDICATED_PAD | CUTANEOUS | Status: DC | PRN

## 2014-06-02 MED ORDER — ONDANSETRON HCL 4 MG/2ML IJ SOLN: 4.0000 mg | Freq: Four times a day (QID) | INTRAMUSCULAR | Status: DC | PRN

## 2014-06-02 MED ORDER — ONDANSETRON HCL 4 MG/2ML IJ SOLN
INTRAMUSCULAR | Status: AC
  Filled 2014-06-02: qty 2

## 2014-06-02 MED ORDER — OXYTOCIN 40 UNITS IN LACTATED RINGERS INFUSION - SIMPLE MED
62.5000 mL/h | INTRAVENOUS | Status: DC
  Filled 2014-06-02: qty 1000

## 2014-06-02 MED ORDER — PHENYLEPHRINE 40 MCG/ML (10ML) SYRINGE FOR IV PUSH (FOR BLOOD PRESSURE SUPPORT)
PREFILLED_SYRINGE | INTRAVENOUS | Status: AC
  Filled 2014-06-02: qty 5

## 2014-06-02 MED ORDER — DIPHENHYDRAMINE HCL 25 MG PO CAPS: 25.0000 mg | ORAL_CAPSULE | Freq: Four times a day (QID) | ORAL | Status: DC | PRN

## 2014-06-02 MED ORDER — SENNOSIDES-DOCUSATE SODIUM 8.6-50 MG PO TABS
2.0000 | ORAL_TABLET | ORAL | Status: DC
  Administered 2014-06-04: 2 via ORAL
  Filled 2014-06-02: qty 2

## 2014-06-02 MED ORDER — NALOXONE HCL 1 MG/ML IJ SOLN
1.0000 ug/kg/h | INTRAVENOUS | Status: DC | PRN
  Filled 2014-06-02: qty 2

## 2014-06-02 MED ORDER — KETOROLAC TROMETHAMINE 30 MG/ML IJ SOLN
INTRAMUSCULAR | Status: AC
  Administered 2014-06-02: 30 mg via INTRAVENOUS
  Filled 2014-06-02: qty 1

## 2014-06-02 MED ORDER — LIDOCAINE HCL (PF) 1 % IJ SOLN: 30.0000 mL | INTRAMUSCULAR | Status: DC | PRN

## 2014-06-02 MED ORDER — LIDOCAINE-EPINEPHRINE (PF) 2 %-1:200000 IJ SOLN
INTRAMUSCULAR | Status: AC
  Filled 2014-06-02: qty 20

## 2014-06-02 MED ORDER — OXYTOCIN 40 UNITS IN LACTATED RINGERS INFUSION - SIMPLE MED
1.0000 m[IU]/min | INTRAVENOUS | Status: DC
  Administered 2014-06-02: 2 m[IU]/min via INTRAVENOUS

## 2014-06-02 MED ORDER — LIDOCAINE HCL (PF) 1 % IJ SOLN
INTRAMUSCULAR | Status: DC | PRN
  Administered 2014-06-02 (×4): 4 mL

## 2014-06-02 MED ORDER — SIMETHICONE 80 MG PO CHEW
80.0000 mg | CHEWABLE_TABLET | Freq: Three times a day (TID) | ORAL | Status: DC
  Administered 2014-06-03 – 2014-06-04 (×4): 80 mg via ORAL
  Filled 2014-06-02 (×4): qty 1

## 2014-06-02 MED ORDER — OXYTOCIN 10 UNIT/ML IJ SOLN
40.0000 [IU] | INTRAVENOUS | Status: DC | PRN
  Administered 2014-06-02: 40 [IU] via INTRAVENOUS

## 2014-06-02 MED ORDER — DIPHENHYDRAMINE HCL 25 MG PO CAPS: 25.0000 mg | ORAL_CAPSULE | ORAL | Status: DC | PRN

## 2014-06-02 MED ORDER — LACTATED RINGERS IV SOLN
INTRAVENOUS | Status: DC
  Administered 2014-06-02 (×4): via INTRAVENOUS

## 2014-06-02 SURGICAL SUPPLY — 35 items
APL SKNCLS STERI-STRIP NONHPOA (GAUZE/BANDAGES/DRESSINGS) ×1
BENZOIN TINCTURE PRP APPL 2/3 (GAUZE/BANDAGES/DRESSINGS) ×2 IMPLANT
CLAMP CORD UMBIL (MISCELLANEOUS) ×2 IMPLANT
CLOTH BEACON ORANGE TIMEOUT ST (SAFETY) ×2 IMPLANT
COVER LIGHT HANDLE  1/PK (MISCELLANEOUS) ×2
COVER LIGHT HANDLE 1/PK (MISCELLANEOUS) ×2 IMPLANT
DRAPE SHEET LG 3/4 BI-LAMINATE (DRAPES) ×2 IMPLANT
DRSG OPSITE POSTOP 4X10 (GAUZE/BANDAGES/DRESSINGS) ×2 IMPLANT
DURAPREP 26ML APPLICATOR (WOUND CARE) ×2 IMPLANT
ELECT REM PT RETURN 9FT ADLT (ELECTROSURGICAL) ×2
ELECTRODE REM PT RTRN 9FT ADLT (ELECTROSURGICAL) ×1 IMPLANT
EXTRACTOR VACUUM KIWI (MISCELLANEOUS) IMPLANT
GLOVE BIO SURGEON ST LM GN SZ9 (GLOVE) ×2 IMPLANT
GLOVE BIOGEL PI IND STRL 9 (GLOVE) ×1 IMPLANT
GLOVE BIOGEL PI INDICATOR 9 (GLOVE) ×1
GOWN STRL REUS W/TWL 2XL LVL3 (GOWN DISPOSABLE) ×2 IMPLANT
GOWN STRL REUS W/TWL LRG LVL3 (GOWN DISPOSABLE) ×2 IMPLANT
NEEDLE HYPO 25X5/8 SAFETYGLIDE (NEEDLE) ×2 IMPLANT
NS IRRIG 1000ML POUR BTL (IV SOLUTION) ×2 IMPLANT
PACK C SECTION WH (CUSTOM PROCEDURE TRAY) ×2 IMPLANT
PAD OB MATERNITY 4.3X12.25 (PERSONAL CARE ITEMS) ×2 IMPLANT
RTRCTR C-SECT PINK 25CM LRG (MISCELLANEOUS) ×2 IMPLANT
RTRCTR C-SECT PINK 34CM XLRG (MISCELLANEOUS) IMPLANT
STRIP CLOSURE SKIN 1/2X4 (GAUZE/BANDAGES/DRESSINGS) ×2 IMPLANT
SUT MNCRL 0 VIOLET CTX 36 (SUTURE) ×2 IMPLANT
SUT MONOCRYL 0 CTX 36 (SUTURE) ×2
SUT VIC AB 0 CT1 27 (SUTURE) ×2
SUT VIC AB 0 CT1 27XBRD ANBCTR (SUTURE) ×1 IMPLANT
SUT VIC AB 2-0 CT1 27 (SUTURE) ×4
SUT VIC AB 2-0 CT1 TAPERPNT 27 (SUTURE) ×2 IMPLANT
SUT VIC AB 4-0 KS 27 (SUTURE) ×2 IMPLANT
SYR BULB IRRIGATION 50ML (SYRINGE) IMPLANT
TOWEL OR 17X24 6PK STRL BLUE (TOWEL DISPOSABLE) ×2 IMPLANT
TRAY FOLEY CATH 14FR (SET/KITS/TRAYS/PACK) ×2 IMPLANT
WATER STERILE IRR 1000ML POUR (IV SOLUTION) IMPLANT

## 2014-06-02 NOTE — Consult Note (Signed)
Neonatology Note:  Attendance at C-section:  I was asked by Dr. Ferguson to attend this repeat C/S at 38 5/7 weeks due to fetal intolerance to labor. The mother is a G2P1 A pos, GBS neg with attempted VBAC. ROM 20 hours prior to delivery, fluid clear. Infant was floppy, pale, dusky, and apneic at birth, but HR was about 80. We bulb suctioned for some very thick, clear mucous and noted some respiratory effort. We gave vigorous stimulation; the baby breathed irregularly at first, but the HR came up to > 100 quickly, and the baby started to cry at about 1.5 minutes. She pinked up rapidly in room air. Tone remained decreased for several minutes, but she had active movement of all extremities at 5 minutes. Ap 5/9. Lungs clear to ausc in DR, no distress. To CN to care of Pediatrician.  Tracey Bean C. Frederika Hukill, MD  

## 2014-06-02 NOTE — Transfer of Care (Signed)
Immediate Anesthesia Transfer of Care Note  Patient: Tracey Bean  Procedure(s) Performed: Procedure(s): Primary Cesarean Section Delivery Baby Girl @ 1834, (N/A)  Patient Location: PACU  Anesthesia Type:Epidural  Level of Consciousness: awake, alert , oriented and patient cooperative  Airway & Oxygen Therapy: Patient Spontanous Breathing  Post-op Assessment: Report given to PACU RN and Post -op Vital signs reviewed and stable  Post vital signs: Reviewed and stable  Complications: No apparent anesthesia complications

## 2014-06-02 NOTE — Anesthesia Procedure Notes (Signed)
Epidural Patient location during procedure: OB Start time: 06/02/2014 3:11 PM  Staffing Anesthesiologist: Eli Pattillo Performed by: anesthesiologist   Preanesthetic Checklist Completed: patient identified, site marked, surgical consent, pre-op evaluation, timeout performed, IV checked, risks and benefits discussed and monitors and equipment checked  Epidural Patient position: sitting Prep: site prepped and draped and DuraPrep Patient monitoring: continuous pulse ox and blood pressure Approach: midline Location: L3-L4 Injection technique: LOR air  Needle:  Needle type: Tuohy  Needle gauge: 17 G Needle length: 9 cm and 9 Needle insertion depth: 8.5 cm Catheter type: closed end flexible Catheter size: 19 Gauge Catheter at skin depth: 14 cm Test dose: negative  Assessment Events: blood not aspirated, injection not painful, no injection resistance, negative IV test and no paresthesia  Additional Notes Discussed risk of headache, infection, bleeding, nerve injury and failed or incomplete block.  Patient voices understanding and wishes to proceed.  Epidural placed easily on first pass.  No paresthesia.  Patient tolerated procedure well with no apparent complications.  Jasmine DecemberA. Kamoni Gentles, MDReason for block:procedure for pain

## 2014-06-02 NOTE — Brief Op Note (Signed)
06/01/2014 - 06/02/2014  7:13 PM  PATIENT:  Tracey Bean  28 y.o. female  PRE-OPERATIVE DIAGNOSIS:  Nonreassuring Fetal Heart Rate pregnancy 38 weeks 5 day trial of labor unsuccessful  POST-OPERATIVE DIAGNOSIS:  Nonreassuring Fetal Heart Rate, suspected cephalopelvic disproportion  PROCEDURE:  Procedure(s): Primary Cesarean Section Delivery Baby Girl @ 1834, (N/A)  SURGEON:  Surgeon(s) and Role:    * Tilda BurrowJohn Madelina Sanda V, MD - Primary  PHYSICIAN ASSISTANT: Danelle EarthlyNoel ____ MD  ASSISTANTS: none   ANESTHESIA:   epidural  EBL:  Total I/O In: -  Out: 400 [Blood:400]  BLOOD ADMINISTERED:none  DRAINS: Urinary Catheter (Foley)   LOCAL MEDICATIONS USED:  NONE  SPECIMEN:  Source of Specimen:  Placenta delivered to labor and delivery  DISPOSITION OF SPECIMEN:  Labor and delivery  COUNTS:  YES  TOURNIQUET:  * No tourniquets in log *  DICTATION: .Dragon Dictation  PLAN OF CARE: Admit to inpatient   PATIENT DISPOSITION:  PACU - hemodynamically stable.   Delay start of Pharmacological VTE agent (>24hrs) due to surgical blood loss or risk of bleeding: Not applicable

## 2014-06-02 NOTE — Anesthesia Postprocedure Evaluation (Signed)
  Anesthesia Post-op Note  Patient: Tracey Bean  Procedure(s) Performed: Procedure(s): Primary Cesarean Section Delivery Baby Girl @ 1834, (N/A)  Patient Location: PACU  Anesthesia Type:Epidural  Level of Consciousness: awake, alert  and oriented  Airway and Oxygen Therapy: Patient Spontanous Breathing  Post-op Pain: none  Post-op Assessment: Post-op Vital signs reviewed, Patient's Cardiovascular Status Stable, Respiratory Function Stable, Patent Airway, No signs of Nausea or vomiting, Pain level controlled, No headache, No backache and No residual numbness  Post-op Vital Signs: Reviewed and stable  Last Vitals:  Filed Vitals:   06/02/14 2015  BP: 131/75  Pulse: 69  Temp:   Resp: 25    Complications: No apparent anesthesia complications

## 2014-06-02 NOTE — Progress Notes (Signed)
LABOR PROGRESS NOTE  Tracey Bean is a 28 y.o. G2P1001 at 5319w5d  admitted for rupture of membranes  Subjective: Felt a contraction earlier when she walked to bathroom  Objective: BP 131/65  Pulse 81  Temp(Src) 97.9 F (36.6 C) (Oral)  Resp 20  Ht 5\' 4"  (1.626 m)  Wt 316 lb (143.337 kg)  BMI 54.21 kg/m2  SpO2 98%  LMP 09/04/2013 or  Filed Vitals:   06/02/14 0608 06/02/14 0702 06/02/14 0802 06/02/14 1002  BP: 107/56 125/66 131/65   Pulse: 88 78 81   Temp:   97.9 F (36.6 C)   TempSrc:   Oral   Resp: 18  20 20   Height:      Weight:      SpO2:           FHT:  FHR: 145 bpm, variability: moderate,  accelerations:  Present,  decelerations:  Absent UC:   none SVE:   Dilation: Fingertip Effacement (%): Thick Exam by:: Theordore Cisnero, md  Dilation: Fingertip Effacement (%): Thick Exam by:: Garren Greenman, md   Labs: Lab Results  Component Value Date   WBC 9.5 06/02/2014   HGB 11.8* 06/02/2014   HCT 34.5* 06/02/2014   MCV 83.1 06/02/2014   PLT 188 06/02/2014    Assessment / Plan: Induction of labor due to premature rupture of membranes  Labor: no cervical change, agreeable to foley balloon and starting pitocin.  discussed increased risk of uterine rupture with pitocin, will increase cautiously - FB placed at 1030 with great difficulty 2/2 very soft cervix, excess adipose tissue, overall patient tolerated very well.  After multiple attempts, cervical exam 2/thick/-3 Fetal Wellbeing:  Category I Pain Control:  Fentanyl Anticipated MOD:  NSVD  Amber Guthridge ROCIO, MD 06/02/2014, 10:26 AM

## 2014-06-02 NOTE — Anesthesia Preprocedure Evaluation (Addendum)
Anesthesia Evaluation  Patient identified by MRN, date of birth, ID band Patient awake    Reviewed: Allergy & Precautions, H&P , NPO status , Patient's Chart, lab work & pertinent test results, reviewed documented beta blocker date and time   History of Anesthesia Complications (+) PONV  Airway Mallampati: III TM Distance: >3 FB Neck ROM: full    Dental  (+) Teeth Intact   Pulmonary neg pulmonary ROS,  breath sounds clear to auscultation        Cardiovascular negative cardio ROS  Rhythm:regular Rate:Normal     Neuro/Psych negative neurological ROS  negative psych ROS   GI/Hepatic negative GI ROS, Neg liver ROS,   Endo/Other  Morbid obesity (BMI 54.4)  Renal/GU negative Renal ROS     Musculoskeletal   Abdominal   Peds  Hematology negative hematology ROS (+)   Anesthesia Other Findings   Reproductive/Obstetrics (+) Pregnancy (h/o C/S x1, attempting VBAC, nonreasurring fetal surveillance --> C/S)                          Anesthesia Physical Anesthesia Plan  ASA: III and emergent  Anesthesia Plan: Epidural   Post-op Pain Management:    Induction:   Airway Management Planned:   Additional Equipment:   Intra-op Plan:   Post-operative Plan:   Informed Consent: I have reviewed the patients History and Physical, chart, labs and discussed the procedure including the risks, benefits and alternatives for the proposed anesthesia with the patient or authorized representative who has indicated his/her understanding and acceptance.     Plan Discussed with: Surgeon and CRNA  Anesthesia Plan Comments:        Anesthesia Quick Evaluation

## 2014-06-02 NOTE — H&P (Signed)
Vester Mady HaagensenO Bass-Gatlin is a 28 y.o. female presenting for PROM at term at 2230hrs.  Not feeling many contractions. . Maternal Medical History:  Reason for admission: Rupture of membranes.  Nausea. At 2230 on 06/01/14  Contractions: Onset was 1-2 hours ago.   Frequency: irregular.   Perceived severity is mild.    Fetal activity: Perceived fetal activity is normal.   Last perceived fetal movement was within the past hour.    Prenatal complications: Bleeding (show).   Prenatal Complications - Diabetes: none.    OB History   Grav Para Term Preterm Abortions TAB SAB Ect Mult Living   2 1 1  0 0 0 0 0 0 1     Past Medical History  Diagnosis Date  . PONV (postoperative nausea and vomiting)   . Medical history non-contributory    Past Surgical History  Procedure Laterality Date  . Cesarean section     Family History: family history includes Asthma in her sister; Diabetes in her father and mother; Stroke in her father. Social History:  reports that she has never smoked. She has never used smokeless tobacco. She reports that she does not drink alcohol or use illicit drugs.   Review of Systems  Constitutional: Negative for fever, chills and malaise/fatigue.  Gastrointestinal: Negative for nausea, vomiting and abdominal pain.  Genitourinary:       Leaking fluid since 2230   Musculoskeletal: Positive for back pain.      Blood pressure 113/70, pulse 105, temperature 98.3 F (36.8 C), temperature source Oral, resp. rate 18, last menstrual period 09/04/2013, SpO2 99.00%. Maternal Exam:  Uterine Assessment: Contraction strength is mild.  Contraction frequency is irregular.   Abdomen: Fundal height is 42.   Estimated fetal weight is 9.   Fetal presentation: vertex  Introitus: Normal vulva. Vulva is negative for lesion.  Vagina is positive for vaginal discharge (clear, pink).  Ferning test: positive.  Nitrazine test: not done. Amniotic fluid character: clear.  Pelvis: adequate  for delivery.   Cervix: Cervix evaluated by digital exam.     Fetal Exam Fetal Monitor Review: Mode: ultrasound.   Baseline rate: 130.  Variability: moderate (6-25 bpm).   Pattern: accelerations present and no decelerations.    Fetal State Assessment: Category I - tracings are normal.     Physical Exam  Constitutional: She is oriented to person, place, and time. She appears well-developed and well-nourished. No distress.  HENT:  Head: Normocephalic.  Cardiovascular: Normal rate.   Respiratory: Effort normal.  GI: Soft. She exhibits no distension. There is no tenderness. There is no rebound and no guarding.  Genitourinary: Uterus normal. Vulva exhibits no lesion. Vaginal discharge (clear, pink) found.   Cervix 1/30/-3/vertex    Musculoskeletal: Normal range of motion.  Neurological: She is alert and oriented to person, place, and time.  Skin: Skin is warm and dry.  Psychiatric: She has a normal mood and affect.    Prenatal labs: ABO, Rh: A/POS/-- (04/22 1353) Antibody: NEG (04/22 1353) Rubella: 1.87 (04/22 1353) RPR: NON REAC (07/15 1327)  HBsAg: NEGATIVE (04/22 1353)  HIV: NONREACTIVE (07/15 1327)  GBS: Negative (09/16 0000)   Assessment/Plan: A:  SIUP at 6258w5d      PROM at term      Previous C/S for FTP (got to 4cm)      Desires TOLAC       GBS Negative   [facility]  [facility]       Routine orders  Discussed expectant management vs Augmentation of labor, patient prefers to wait expectantly for now       Anticipate SVD, but baby measures big.  Will observe   [facility] 06/02/2014, 12:48 AM

## 2014-06-02 NOTE — Op Note (Addendum)
   Procedure: Repeat low transverse cervical cesarean section Surgeon Emelda FearFerguson Details of procedure: Patient was taken operating room, epidural anesthesia topped up, a more some transient attention that responded to pressor agent, and then timeout was conducted, transverse incision repeated in standard method of Pfannenstiel along the line of the previous incision. There was significant fibrosis and subcutaneous fatty tissue. Transverse fascial opening was performed dissection off of the rectus muscles, and the uterine cavity was entered in the midline. Minimal adhesions were encountered. Alexis wound retractor was positioned, and transverse incision in the uterus made along the lines of the previous incision, easily in the uterine cavity at the level of the shoulder. The vertex was rotated into the incision and delivered easily, the baby was hypotonic and was passed promptly to the pathologist with a generous amount of cord attached to allow for autotransfusion if desired. Veress 5 and 9 were assigned. The gas pH was 7.08. Placenta was normal, released easily was intact three-vessel cord. There was no suspicion of abruption and there was no malodor. The uterus was cleansed with a dry gauze, closed in a 2 layer closure first layer running locking 0 Monocryl and a second layer continuous running 0 Monocryl. Hemostasis was good. It was noted that there were some fimbrial adhesions from the left fallopian tube to the anterior uterus at the site of previous cesarean incision these were released the ovary and tube to resume a more normal position. Peritoneal cavity was closed using 2-0 Vicryl, the fascia closed running 0 Vicryl subcutaneous tissues irrigated copiously, the fibrotic scar tissue in the inferior aspects of the incision released with a transverse releasing incision midline from the fascia and the skin, and then the fatty tissue reapproximated using interrupted 2-0 Vicryl x3 horizontal mattress sutures,  with good tissue approximation. Subcuticular 4-0 Vicryl closure the skin incision completed the procedure with good approximation. EBL 500 cc

## 2014-06-02 NOTE — Progress Notes (Signed)
Tracey Bean is a 28 y.o. G2P1001 at 4552w5d by ultrasound admitted for rupture of membranes, desire for TOLAC. Prior cesarean at 4 cm.Called to assess nonreassuring FHR with repetitive decels, probable lates, with beat to beat present. Fhr in normal range at present after d/c pitocin and position changes.  Subjective:   Objective: BP 124/67  Pulse 89  Temp(Src) 97.9 F (36.6 C) (Oral)  Resp 20  Ht 5\' 4"  (1.626 m)  Wt 316 lb (143.337 kg)  BMI 54.21 kg/m2  SpO2 100%  LMP 09/04/2013      FHT:  FHR: 90-145 bpm, variability: minimal ,  accelerations:  Present,  decelerations:  Present probable late UC:   irregular, every 5 minutes SVE:   Dilation: 6 Effacement (%): 90 Station: -2 Exam by:: dr. Emelda Fearferguson Station at -3 vertex NOT entering pelvic inlet Labs: Lab Results  Component Value Date   WBC 9.5 06/02/2014   HGB 11.8* 06/02/2014   HCT 34.5* 06/02/2014   MCV 83.1 06/02/2014   PLT 188 06/02/2014    Assessment / Plan: Arrest of decent  Labor: not tolerated by infant Preeclampsia:   Fetal Wellbeing:  Category II Pain Control:  Epidural I/D:  . Anticipated MOD:  Cesarean recommended , with risks, benefits potential for complications such as infection, bladder injury reviewed with pt To OR  Now.  Stanley Helmuth V 06/02/2014, 6:11 PM

## 2014-06-02 NOTE — Progress Notes (Signed)
Patient ID: Collie SiadMelisa O Bass-Gatlin, female   DOB: 1985-08-31, 28 y.o.   MRN: [id number] Still in early labor  FHR reactive UCs not tracing well, every 3-57min per RN  Filed Vitals:   06/02/14 0325 06/02/14 0340 06/02/14 0345 06/02/14 0500  BP: 103/61  128/64 119/55  Pulse: 86  96 76  Temp: 97.9 F (36.6 C)     TempSrc: Oral     Resp: 18     Height:  5\' 4"  (1.626 m)    Weight:  316 lb (143.337 kg)    SpO2:       Dilation: 1 Effacement (%): 30 Exam by:: Wynelle BourgeoisMarie Daveion Robar CNM  Will continue to observe and reassess for Pitocin Augmentation in morning

## 2014-06-02 NOTE — Progress Notes (Addendum)
Patient [facility] via wheelchair from MAU to [facility] .  Patient in bed-Ultra sound placed on abdomen and FHR traced. Patient VSS with no complaint of pain.  Bed in low locked position-call bell in reach. Will continue to monitor.

## 2014-06-03 ENCOUNTER — Encounter (HOSPITAL_COMMUNITY): Payer: Self-pay | Admitting: Obstetrics and Gynecology

## 2014-06-03 LAB — CBC
HCT: 32.2 % — ABNORMAL LOW (ref 36.0–46.0)
Hemoglobin: 10.8 g/dL — ABNORMAL LOW (ref 12.0–15.0)
MCH: 27.6 pg (ref 26.0–34.0)
MCHC: 33.5 g/dL (ref 30.0–36.0)
MCV: 82.4 fL (ref 78.0–100.0)
Platelets: 171 10*3/uL (ref 150–400)
RBC: 3.91 MIL/uL (ref 3.87–5.11)
RDW: 15.2 % (ref 11.5–15.5)
WBC: 14.3 10*3/uL — ABNORMAL HIGH (ref 4.0–10.5)

## 2014-06-03 NOTE — Progress Notes (Signed)
1 Day Post-Op Procedure(s) (LRB):rLTCS, failed TOL Repeat Cesarean Section Delivery Baby Girl @ 1834  Subjective: Patient reports tolerating PO, + flatus and no problems voiding. States feeling well.    Objective: I have reviewed patient's vital signs, intake and output, medications and labs. Pleasant, alert. Incision intact with no drainage or swelling noted. Bowel sounds present in all quads. Vaginal bleeding minimal. No calf edema or pain.   Assessment: s/p Procedure(s): Primary Cesarean Section Delivery Baby Girl @ [phone], (N/A): stable, progressing well and tolerating diet  Plan: Advance diet Encourage ambulation Discontinue IV fluids Contraception: Mirena  LOS: 2 days    Shaina Gullatt SHWON 06/03/2014, 7:33 AM

## 2014-06-03 NOTE — Anesthesia Postprocedure Evaluation (Signed)
  Anesthesia Post-op Note  Anesthesia Post Note  Patient: Tracey Bean  Procedure(s) Performed: Procedure(s) (LRB): Primary Cesarean Section Delivery Baby Girl @ [phone], (N/A)  Anesthesia type: Epidural  Patient location: Mother/Baby  Post pain: Pain level controlled  Post assessment: Post-op Vital signs reviewed  Last Vitals:  Filed Vitals:   06/03/14 0610  BP: 126/59  Pulse: 98  Temp: 36.9 C  Resp: 20    Post vital signs: Reviewed  Level of consciousness:alert  Complications: No apparent anesthesia complications

## 2014-06-03 NOTE — Progress Notes (Signed)
I have seen and examined this patient and I agree with the above. Bottlefeeding. Cam HaiSHAW, KIMBERLY CNM 9:13 AM 06/03/2014

## 2014-06-03 NOTE — Progress Notes (Signed)
UR completed 

## 2014-06-03 NOTE — Addendum Note (Signed)
Addendum created 06/03/14 0754 by Turner DanielsJennifer L Alp Goldwater, CRNA   Modules edited: Charges VN, Notes Section   Notes Section:  File: [id number]

## 2014-06-04 LAB — BIRTH TISSUE RECOVERY COLLECTION (PLACENTA DONATION)

## 2014-06-04 MED ORDER — IBUPROFEN 600 MG PO TABS: 600.0000 mg | ORAL_TABLET | Freq: Four times a day (QID) | ORAL | Status: DC | PRN

## 2014-06-04 NOTE — Discharge Instructions (Signed)

## 2014-06-04 NOTE — Discharge Summary (Signed)
Obstetric Discharge Summary Reason for Admission: rupture of membranes Prenatal Procedures: none Intrapartum Procedures: cesarean: low cervical, transverse Postpartum Procedures: none Complications-Operative and Postpartum: none  Hospital Course:  Patient presented with PROM at term with minimal contractions. Patient desired TOLAC and was started on augmentation with pitocin and foley balloon. She developed nonreassuring FHR with repetitive decels and underwent repeat cesarean.  Op Note Procedure: Repeat low transverse cervical cesarean section  Surgeon Emelda FearFerguson  Details of procedure: Patient was taken operating room, epidural anesthesia topped up, a more some transient attention that responded to pressor agent, and then timeout was conducted, transverse incision repeated in standard method of Pfannenstiel along the line of the previous incision. There was significant fibrosis and subcutaneous fatty tissue. Transverse fascial opening was performed dissection off of the rectus muscles, and the uterine cavity was entered in the midline. Minimal adhesions were encountered. Alexis wound retractor was positioned, and transverse incision in the uterus made along the lines of the previous incision, easily in the uterine cavity at the level of the shoulder. The vertex was rotated into the incision and delivered easily, the baby was hypotonic and was passed promptly to the pathologist with a generous amount of cord attached to allow for autotransfusion if desired. Veress 5 and 9 were assigned. The gas pH was 7.08.  Placenta was normal, released easily was intact three-vessel cord. There was no suspicion of abruption and there was no malodor. The uterus was cleansed with a dry gauze, closed in a 2 layer closure first layer running locking 0 Monocryl and a second layer continuous running 0 Monocryl. Hemostasis was good. It was noted that there were some fimbrial adhesions from the left fallopian tube to the  anterior uterus at the site of previous cesarean incision these were released the ovary and tube to resume a more normal position.  Peritoneal cavity was closed using 2-0 Vicryl, the fascia closed running 0 Vicryl subcutaneous tissues irrigated copiously, the fibrotic scar tissue in the inferior aspects of the incision released with a transverse releasing incision midline from the fascia and the skin, and then the fatty tissue reapproximated using interrupted 2-0 Vicryl x3 horizontal mattress sutures, with good tissue approximation. Subcuticular 4-0 Vicryl closure the skin incision completed the procedure with good approximation. EBL 500 cc  On the day of discharge,  Pt denies problems with ambulating, voiding or po intake.  She denies nausea or vomiting.  Pain is well controlled.  She has had flatus. She has had bowel movement.  Lochia Small.  Plan for birth control is IUD.  Method of Feeding: bottle.     H/H: Lab Results  Component Value Date/Time   HGB 10.8* 06/03/2014  5:35 AM   HCT 32.2* 06/03/2014  5:35 AM    Filed Vitals:   06/04/14 0526  BP: 128/60  Pulse: 71  Temp: 97.9 F (36.6 C)  Resp: 20    Physical Exam: VSS NAD Abd: Appropriately tender, ND, Fundus firm Incision: Dressing clean, dry, and intact with scant amount of dried blood No c/c/e, Neg homan's sign, neg cords Lochia Appropriate  Discharge Diagnoses: Term Pregnancy-delivered  Discharge Information: Date: 03/08/2011 Activity: pelvic rest Diet: routine  Medications: PNV and Ibuprofen Breast feeding:  No: Bottle Condition: stable Instructions: refer to handout Discharge to: home      Medication List         ibuprofen 600 MG tablet  Commonly known as:  ADVIL,MOTRIN  Take 1 tablet (600 mg total) by mouth every 6 (six)  hours as needed for mild pain, moderate pain or cramping.     prenatal multivitamin Tabs tablet  Take 1 tablet by mouth daily at 12 noon.       Follow-up Information   [facility] Schedule an appointment as soon as possible for a visit in 6 weeks. (for follow up)    Specialty:  Obstetrics and Gynecology   Contact information:   [address] [place] [address] [phone]      Jacquiline DoeParker, Li Fragoso 06/04/2014,9:36 AM

## 2014-06-05 NOTE — Discharge Summary (Signed)
`````  Attestation of Attending Supervision of Advanced Practitioner: Evaluation and management procedures were performed by the PA/NP/CNM/OB Fellow under my supervision/collaboration. Chart reviewed and agree with management and plan.  Verner Kopischke V 06/05/2014 8:03 PM

## 2014-06-16 ENCOUNTER — Encounter: Payer: Self-pay | Admitting: *Deleted

## 2014-06-28 ENCOUNTER — Encounter (HOSPITAL_COMMUNITY): Payer: Self-pay | Admitting: Obstetrics and Gynecology

## 2014-07-05 ENCOUNTER — Ambulatory Visit (INDEPENDENT_AMBULATORY_CARE_PROVIDER_SITE_OTHER): Payer: Medicaid Other | Admitting: Medical

## 2014-07-05 ENCOUNTER — Other Ambulatory Visit (HOSPITAL_COMMUNITY)
Admission: RE | Admit: 2014-07-05 | Discharge: 2014-07-05 | Disposition: A | Discharge status: Home / Self Care | Payer: Medicaid Other | Source: Ambulatory Visit | Attending: Obstetrics & Gynecology | Admitting: Obstetrics & Gynecology

## 2014-07-05 ENCOUNTER — Encounter: Payer: Self-pay | Admitting: Medical

## 2014-07-05 VITALS — BP 123/82 | HR 89 | Temp 98.3°F | Ht 64.0 in | Wt 281.4 lb

## 2014-07-05 DIAGNOSIS — O4290 Premature rupture of membranes, unspecified as to length of time between rupture and onset of labor, unspecified weeks of gestation: Secondary | ICD-10-CM

## 2014-07-05 DIAGNOSIS — Z30430 Encounter for insertion of intrauterine contraceptive device: Secondary | ICD-10-CM

## 2014-07-05 DIAGNOSIS — Z98891 History of uterine scar from previous surgery: Secondary | ICD-10-CM | POA: Insufficient documentation

## 2014-07-05 DIAGNOSIS — Z3040 Encounter for surveillance of contraceptives, unspecified: Secondary | ICD-10-CM

## 2014-07-05 DIAGNOSIS — Z01419 Encounter for gynecological examination (general) (routine) without abnormal findings: Secondary | ICD-10-CM | POA: Insufficient documentation

## 2014-07-05 DIAGNOSIS — Z124 Encounter for screening for malignant neoplasm of cervix: Secondary | ICD-10-CM

## 2014-07-05 DIAGNOSIS — Z9889 Other specified postprocedural states: Secondary | ICD-10-CM

## 2014-07-05 HISTORY — DX: History of uterine scar from previous surgery: Z98.891

## 2014-07-05 LAB — POCT PREGNANCY, URINE: Preg Test, Ur: NEGATIVE

## 2014-07-05 LAB — HM PAP SMEAR

## 2014-07-05 MED ORDER — LEVONORGESTREL 20 MCG/24HR IU IUD
INTRAUTERINE_SYSTEM | Freq: Once | INTRAUTERINE | Status: AC
  Administered 2014-07-05: 15:00:00 via INTRAUTERINE

## 2014-07-05 NOTE — Progress Notes (Signed)
Patient ID: Tracey Bean, female   DOB: 08-31-1985, 28 y.o.   MRN: [id number] Subjective:     Tracey Bean Bean is a 28 y.o. female who presents for a postpartum visit. She is 5 weeks postpartum following a low cervical transverse Cesarean section. I have fully reviewed the prenatal and intrapartum course. The delivery was at 38.5 gestational weeks. Outcome: repeat cesarean section, low transverse incision. Anesthesia: epidural. Postpartum course has been normal. Baby's course has been normal. Baby is feeding by bottle - Similac Advance. Bleeding no bleeding. Bowel function is normal. Bladder function is normal. Patient is not sexually active. Contraception method is none. Postpartum depression screening: negative.  Patient had LSIL pap on 12/16/13 while pregnant. Colpo was performed on 02/03/14 and no biopsies were taken. No visible lesions on colposcopy.   The following portions of the patient's history were reviewed and updated as appropriate: allergies, current medications, past family history, past medical history, past social history, past surgical history and problem list.  Review of Systems Pertinent items are noted in HPI.   Objective:    BP 123/82 mmHg  Pulse 89  Temp(Src) 98.3 F (36.8 C)  Ht 5\' 4"  (1.626 m)  Wt 281 lb 6.4 oz (127.642 kg)  BMI 48.28 kg/m2  Breastfeeding? No  General:  alert and cooperative   Breasts:  not performed  Lungs: normal effort  Heart:  normal rate  Abdomen: soft, non-tender; bowel sounds normal; no masses,  no organomegaly   Vulva:  normal  Vagina: normal vagina, no discharge, exudate, lesion, or erythema  Cervix:  normal contour, scant bleeding following pap smear  Corpus: normal  Adnexa:  no mass, fullness, tenderness  Rectal Exam: Not performed.         GYNECOLOGY CLINIC PROCEDURE NOTE  IUD Insertion Procedure Note Patient identified, informed consent performed.  Discussed risks of irregular bleeding, cramping, infection,  malpositioning or misplacement of the IUD outside the uterus which may require further procedure such as laparoscopy. Time out was performed.  Urine pregnancy test negative.  Speculum placed in the vagina.  Cervix visualized.  Cleaned with Betadine x 2.  Grasped anteriorly with a single tooth tenaculum.  Uterus sounded to 7 cm.  Mirena IUD placed per manufacturer's recommendations.  Strings trimmed to 3 cm. Tenaculum was removed, good hemostasis noted.  Patient tolerated procedure well.   Patient was given post-procedure instructions.  She was advised to be have backup contraception for one week.  Patient was also asked to check IUD strings periodically and follow up in 4 weeks for IUD check.   Assessment:     Normal postpartum exam. Pap smear done at today's visit.  Plan:    1. Contraception: IUD. Care after insertion instructions given 2. Patient will be contacted with any abnormal pap smear results 3. Follow up in: 4 weeks for string check or as needed.

## 2014-07-05 NOTE — Patient Instructions (Signed)
Abnormal Pap Test Information During a Pap test, the cells on the surface of your cervix are checked to see if they look normal, abnormal, or if they show signs of having been altered by a certain type of virus called human papillomavirus, or HPV. Cervical cells that have been affected by HPV are called dysplasia. Dysplasia is not cancer, but describes abnormal cells found on the surface of the cervix. Depending on the degree of dysplasia, some of the cells may be considered pre-cancerous and may turn into cancer over time if follow up with a caregiver is delayed.  WHAT DOES AN ABNORMAL PAP TEST MEAN? Having an abnormal pap test does not mean that you have cancer. However, certain types of abnormal pap tests can be a sign that a person is at a higher risk of developing cancer. Your caregiver will want to do other tests to find out more about the abnormal cells. Your abnormal Pap test results could show:   Small and uncertain changes that should be carefully watched.   Cervical dysplasia that has caused mild changes and can be followed over time.  Cervical dysplasia that is more severe and needs to be followed and treated to ensure the problem goes away.  Cancer.  When severe cervical dysplasia is found and treated early, it rarely will grow into cancer.  WHAT WILL BE DONE ABOUT MY ABNORMAL PAP TEST?  A colposcopy may be needed. This is a procedure where your cervix is examined using light and magnification.  A small tissue sample of your cervix (biopsy) may need to be removed and then examined. This is often performed if there are areas that appear infected.  A sample of cells from the cervical canal may be removed with either a small brush or scraping instrument (curette). Based on the results of the procedures above, some caregivers may recommend either cryotherapy of the cervix or a surgical LEEP where a portion of the cervix is removed. LEEP is short for "loop electrical excisional  procedure." Rarely, a caregiver may recommend a cone biopsy.This is a procedure where a small, cone-shaped sample of your cervix is taken out. The part that is taken out is the area where the abnormal cells are.  WHAT IF I HAVE A DYSPLASIA OR A CANCER? You may be referred to a specialist. Radiation may also be a treatment for more advanced cancer. Having a hysterectomy is the last treatment option for dysplasia, but it is a more common treatment for someone with cancer. All treatment options will be discussed with you by your caregiver. WHAT SHOULD YOU DO AFTER BEING TREATED? If you have had an abnormal pap test, you should continue to have regular pap tests and check-ups as directed by your caregiver. Your cervical problem will be carefully watched so it does not get worse. Also, your caregiver can watch for, and treat, any new problems that may come up. Document Released: 11/28/2010 Document Revised: 12/08/2012 Document Reviewed: 08/09/2011 [employer] Patient Information 2015 [employer], [place]. This information is not intended to replace advice given to you by your health care provider. Make sure you discuss any questions you have with your health care provider. Intrauterine Device Insertion, Care After Refer to this sheet in the next few weeks. These instructions provide you with information on caring for yourself after your procedure. Your health care provider may also give you more specific instructions. Your treatment has been planned according to current medical practices, but problems sometimes occur. Call your health care provider if  you have any problems or questions after your procedure. WHAT TO EXPECT AFTER THE PROCEDURE Insertion of the IUD may cause some discomfort, such as cramping. The cramping should improve after the IUD is in place. You may have bleeding after the procedure. This is normal. It varies from light spotting for a few days to menstrual-like bleeding. When the IUD is in place,  a string will extend past the cervix into the vagina for 1-2 inches. The strings should not bother you or your partner. If they do, talk to your health care provider.  HOME CARE INSTRUCTIONS   Check your intrauterine device (IUD) to make sure it is in place before you resume sexual activity. You should be able to feel the strings. If you cannot feel the strings, something may be wrong. The IUD may have fallen out of the uterus, or the uterus may have been punctured (perforated) during placement. Also, if the strings are getting longer, it may mean that the IUD is being forced out of the uterus. You no longer have full protection from pregnancy if any of these problems occur.  You may resume sexual intercourse if you are not having problems with the IUD. The copper IUD is considered immediately effective, and the hormone IUD works right away if inserted within 7 days of your period starting. You will need to use a backup method of birth control for 7 days if the IUD in inserted at any other time in your cycle.  Continue to check that the IUD is still in place by feeling for the strings after every menstrual period.  You may need to take pain medicine such as acetaminophen or ibuprofen. Only take medicines as directed by your health care provider. SEEK MEDICAL CARE IF:   You have bleeding that is heavier or lasts longer than a normal menstrual cycle.  You have a fever.  You have increasing cramps or abdominal pain not relieved with medicine.  You have abdominal pain that does not seem to be related to the same area of earlier cramping and pain.  You are lightheaded, unusually weak, or faint.  You have abnormal vaginal discharge or smells.  You have pain during sexual intercourse.  You cannot feel the IUD strings, or the IUD string has gotten longer.  You feel the IUD at the opening of the cervix in the vagina.  You think you are pregnant, or you miss your menstrual period.  The IUD  string is hurting your sex partner. MAKE SURE YOU:  Understand these instructions.  Will watch your condition.  Will get help right away if you are not doing well or get worse. Document Released: 04/11/2011 Document Revised: 06/03/2013 Document Reviewed: 02/01/2013 [facility] Patient Information 2015 [employer], [place]. This information is not intended to replace advice given to you by your health care provider. Make sure you discuss any questions you have with your health care provider.

## 2014-07-05 NOTE — Addendum Note (Signed)
Addended by: Gerome ApleyZEYFANG, Jhan Conery L on: 07/05/2014 02:57 PM   Modules accepted: Orders

## 2014-07-05 NOTE — Addendum Note (Signed)
Addended by: Louanna RawAMPBELL, [place] M on: 07/05/2014 03:17 PM   Modules accepted: Orders

## 2014-07-08 LAB — CYTOLOGY - PAP

## 2014-07-14 ENCOUNTER — Encounter: Payer: Self-pay | Admitting: General Practice

## 2014-07-14 ENCOUNTER — Ambulatory Visit: Payer: Medicaid Other | Admitting: Obstetrics & Gynecology

## 2014-08-06 ENCOUNTER — Ambulatory Visit (INDEPENDENT_AMBULATORY_CARE_PROVIDER_SITE_OTHER): Payer: Medicaid Other | Admitting: Family Medicine

## 2014-08-06 VITALS — BP 113/70 | HR 89 | Temp 98.1°F | Ht 64.0 in | Wt 284.4 lb

## 2014-08-06 DIAGNOSIS — Z30431 Encounter for routine checking of intrauterine contraceptive device: Secondary | ICD-10-CM

## 2014-08-06 NOTE — Progress Notes (Signed)
Pt is asking about what type of vitamin she should be taking at this time after her pregnancy.

## 2014-08-06 NOTE — Patient Instructions (Signed)

## 2014-08-06 NOTE — Progress Notes (Signed)
    Subjective:    Patient ID: Tracey Bean is a 28 y.o. female presenting with Follow-up  on 08/06/2014  HPI: Here for IUD string check.  No sex.  Had one cycle. Somewhat abnormal but not terrible.  Not nursing.  Review of Systems  Constitutional: Negative for fever and chills.  Respiratory: Negative for shortness of breath.   Cardiovascular: Negative for chest pain.  Gastrointestinal: Negative for nausea, vomiting and abdominal pain.  Genitourinary: Negative for dysuria.  Skin: Negative for rash.      Objective:    BP 113/70 mmHg  Pulse 89  Temp(Src) 98.1 F (36.7 C)  Ht 5\' 4"  (1.626 m)  Wt 284 lb 6.4 oz (129.003 kg)  BMI 48.79 kg/m2  Breastfeeding? No Physical Exam  Constitutional: She is oriented to person, place, and time. She appears well-developed and well-nourished. No distress.  HENT:  Head: Normocephalic and atraumatic.  Eyes: No scleral icterus.  Neck: Neck supple.  Cardiovascular: Normal rate.   Pulmonary/Chest: Effort normal.  Abdominal: Soft.  Genitourinary: Vagina normal.  IUD strings visualized at cervix  Neurological: She is alert and oriented to person, place, and time.  Skin: Skin is warm and dry.  Psychiatric: She has a normal mood and affect.        Assessment & Plan:  IUD check up - Appears to be in place and working well.    Return if symptoms worsen or fail to improve.

## 2014-08-12 ENCOUNTER — Encounter: Payer: Self-pay | Admitting: *Deleted

## 2015-06-12 IMAGING — US US OB DETAIL+14 WK
1 series · 12 of 28 positions shown · non-contrast
Comparison: none

[Series 1: us ob comp +14 wk · 12 of 70 slices shown]
[im 3/70]
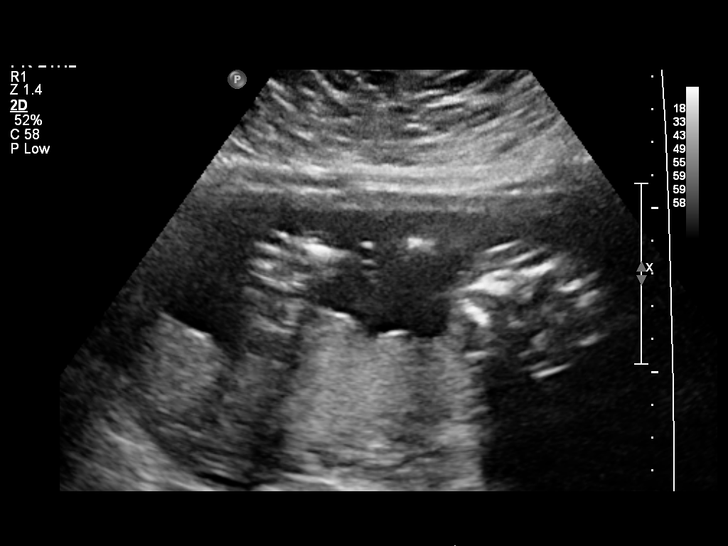
[im 8/70]
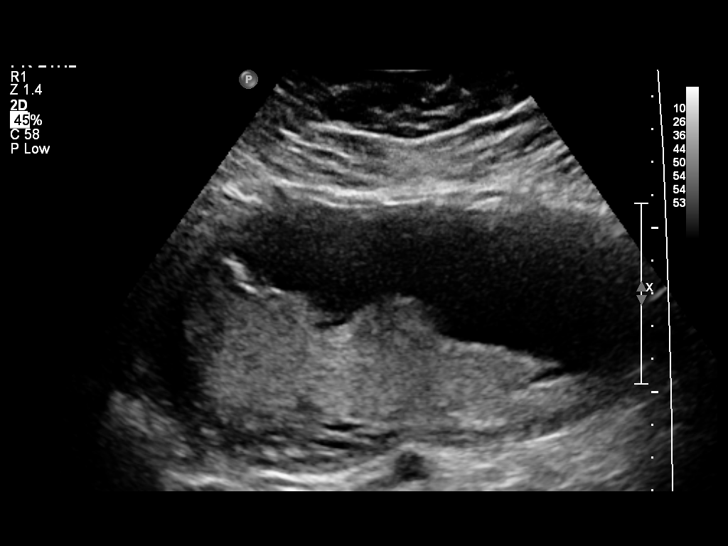
[im 13/70]
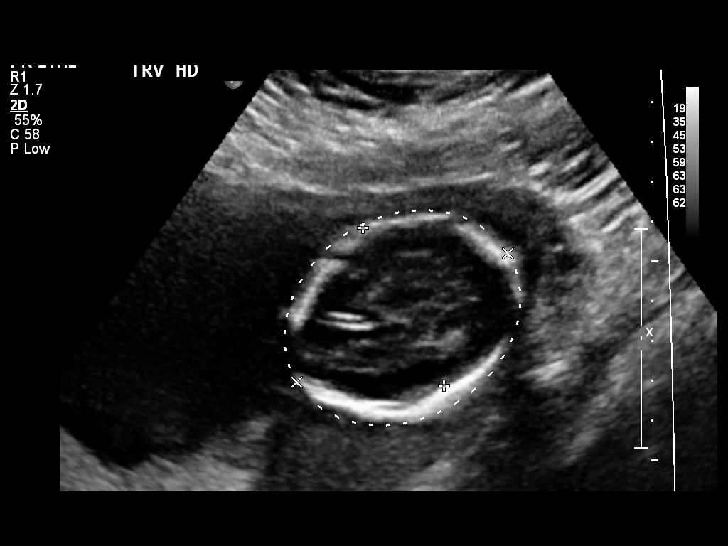
[im 21/70]
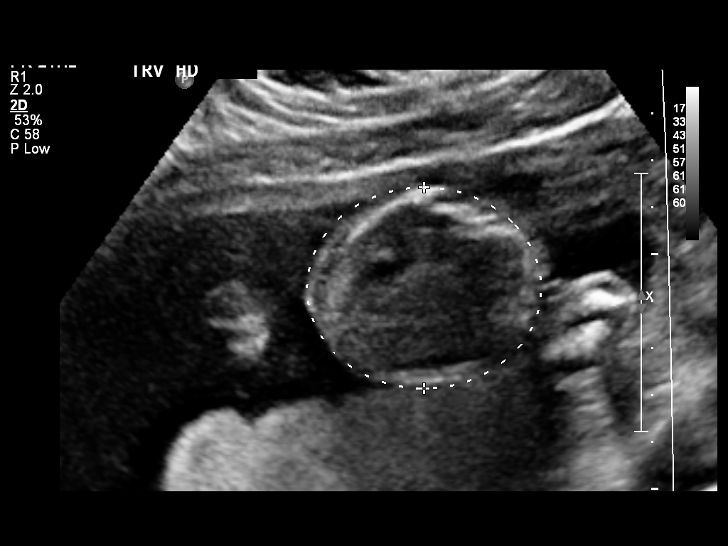
[im 26/70]
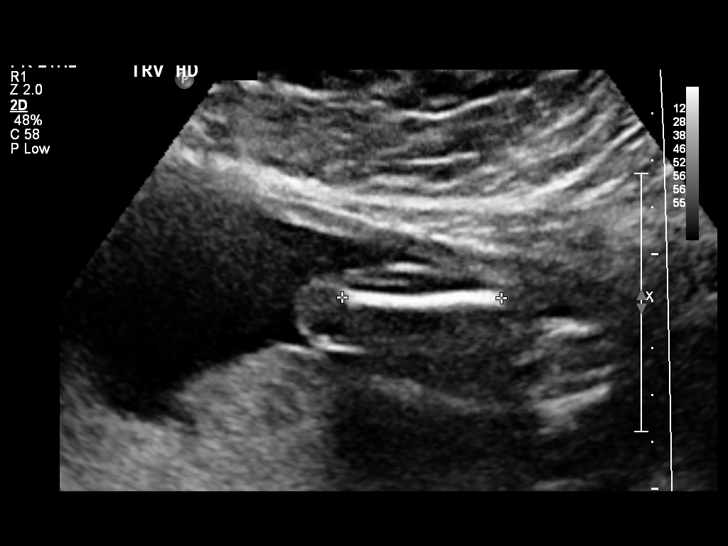
[im 31/70]
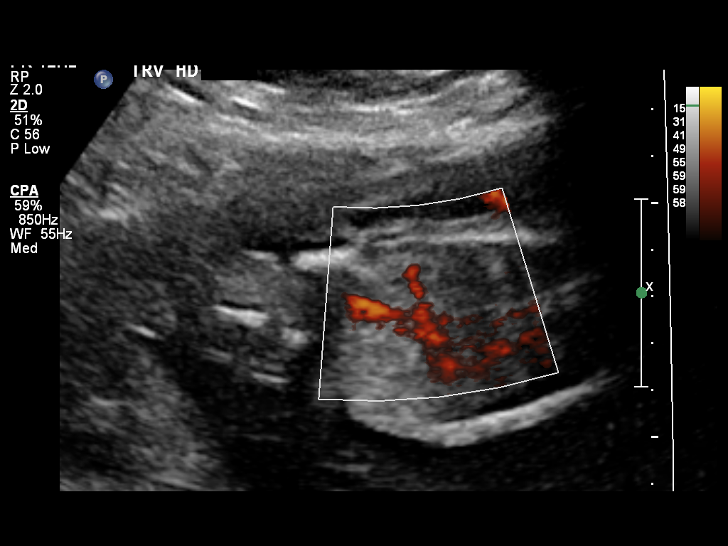
[im 39/70]
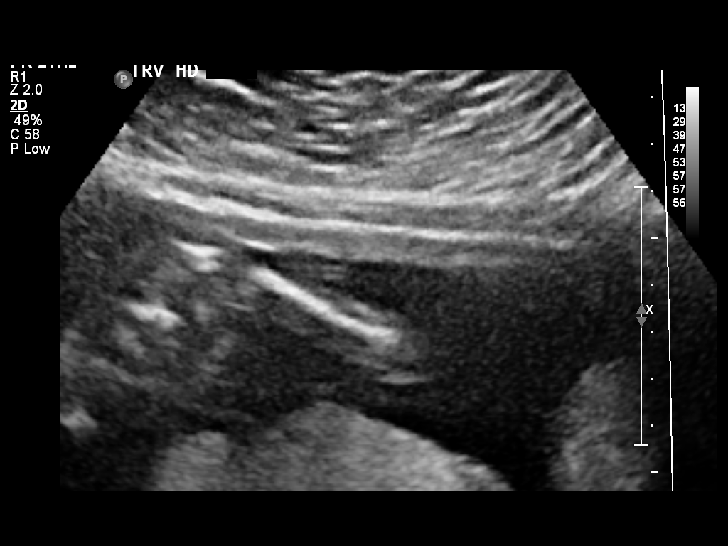
[im 44/70]
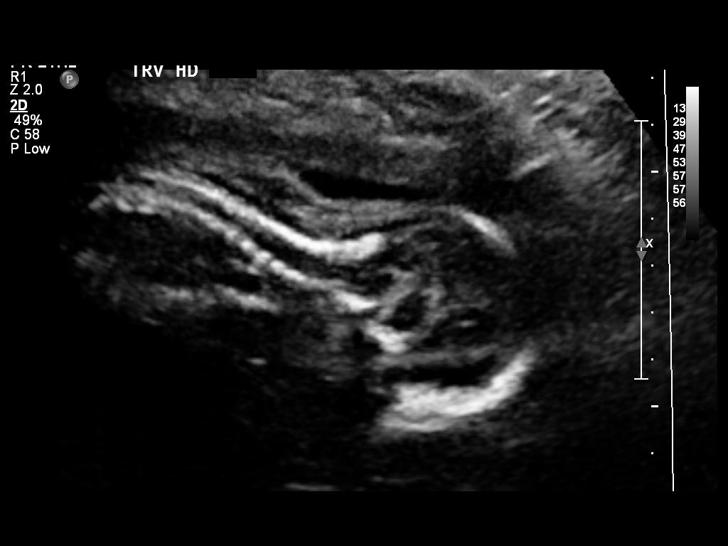
[im 49/70]
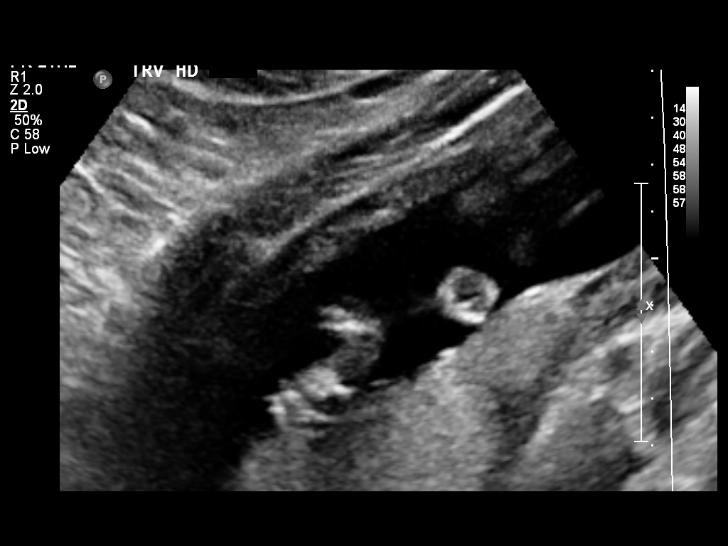
[im 57/70]
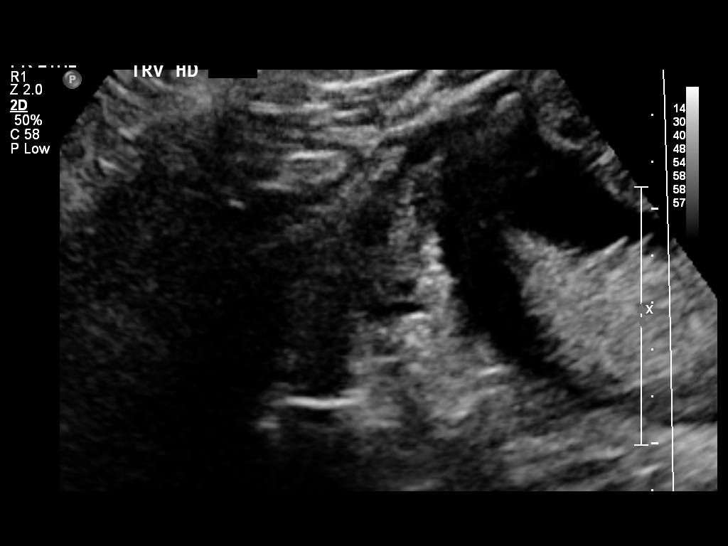
[im 62/70]
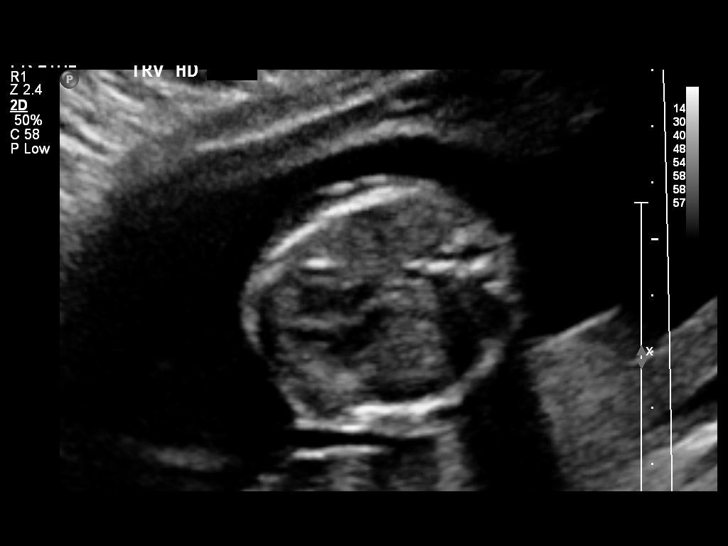
[im 67/70]
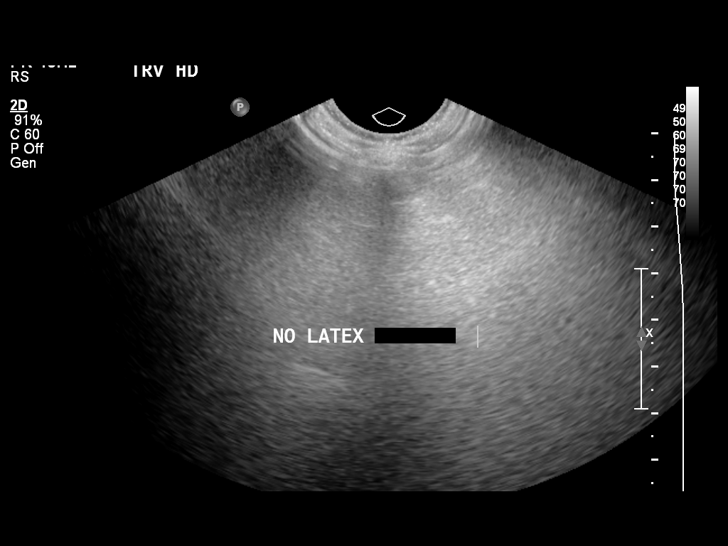

[12 of 28 positions shown; findings below may reference images not displayed]

OBSTETRICS REPORT
                      (Signed Final 01/20/2014 [DATE])

Service(s) Provided

 US OB DETAIL + 14 WK                                  76811.0
 US MFM OB TRANSVAGINAL                                76817.2
Indications

 Detailed fetal anatomic survey
 Maternal morbid obesity
Fetal Evaluation

 Num Of Fetuses:    1
 Fetal Heart Rate:  144                          bpm
 Cardiac Activity:  Observed
 Presentation:      Variable
 Placenta:          Posterior, above cervical
                    os
 P. Cord            Visualized
 Insertion:

 Amniotic Fluid
 AFI FV:      Subjectively within normal limits
                                             Larg Pckt:     5.3  cm
Biometry

 BPD:     44.6  mm     G. Age:  19w 3d                CI:        66.45   70 - 86
                                                      FL/HC:      18.6   16.8 -

 HC:     175.4  mm     G. Age:  20w 0d       58  %    HC/AC:      1.23   1.09 -

 AC:     142.8  mm     G. Age:  19w 4d       42  %    FL/BPD:
 FL:      32.7  mm     G. Age:  20w 1d       61  %    FL/AC:      22.9   20 - 24
 HUM:     32.5  mm     G. Age:  20w 6d       85  %
 CER:     21.3  mm     G. Age:  20w 2d       60  %
 NFT:     3.72  mm

 Est. FW:     320  gm    0 lb 11 oz      51  %
Gestational Age

 LMP:           19w 5d        Date:  09/04/13                 EDD:   06/11/14
 U/S Today:     19w 5d                                        EDD:   06/11/14
 Best:          19w 5d     Det. By:  LMP  (09/04/13)          EDD:   06/11/14
Anatomy

 Cranium:          Appears normal         Aortic Arch:      Not well visualized
 Fetal Cavum:      Appears normal         Ductal Arch:      Not well visualized
 Ventricles:       Appears normal         Diaphragm:        Not well visualized
 Choroid Plexus:   Appears normal         Stomach:          Appears normal, left
                                                            sided
 Cerebellum:       Appears normal         Abdomen:          Appears normal
 Posterior Fossa:  Appears normal         Abdominal Wall:   Appears nml (cord
                                                            insert, abd wall)
 Nuchal Fold:      Appears normal         Cord Vessels:     Appears normal (3
                                                            vessel cord)
 Face:             Appears normal         Kidneys:          Appear normal
                   (orbits and profile)
 Lips:             Not well visualized    Bladder:          Appears normal
 Heart:            Not well visualized    Spine:            Not well visualized
 RVOT:             Not well visualized    Lower             Appears normal
                                          Extremities:
 LVOT:             Not well visualized    Upper             Appears normal
                                          Extremities:

 Other:  Technically difficult due to  maternal habitus.  Gender not well
         visualized.
Targeted Anatomy

 Fetal Central Nervous System
 Lat. Ventricles:
Cervix Uterus Adnexa

 Cervical Length:    4.5      cm

 Cervix:       Measured transvaginally.
 Left Ovary:    Not visualized.
 Right Ovary:   Within normal limits.
Comments

 The patient's fetal anatomic survey is not complete.
 However, no gross fetal anomalies were identified.  A follow-
 up ultrasound should be performed in 4 weeks to reassess
 fetal growth and anatomy.
Impression

 Single living intrauterine pregnancy at 19 weeks 5 days.
 Appropriate fetal growth (51%).
 Normal amniotic fluid volume.
 No gross fetal anomalies identified.
Recommendations

 Recommend follow-up ultrasound examination in 4 weeks.

                Ong, Yannick

## 2015-09-05 IMAGING — US US OB FOLLOW-UP
1 series · 12 of 28 positions shown · non-contrast
Comparison: none

[Series 1: us ob follow up · 64 acquisitions, 12 frames shown]
[im 3/64]
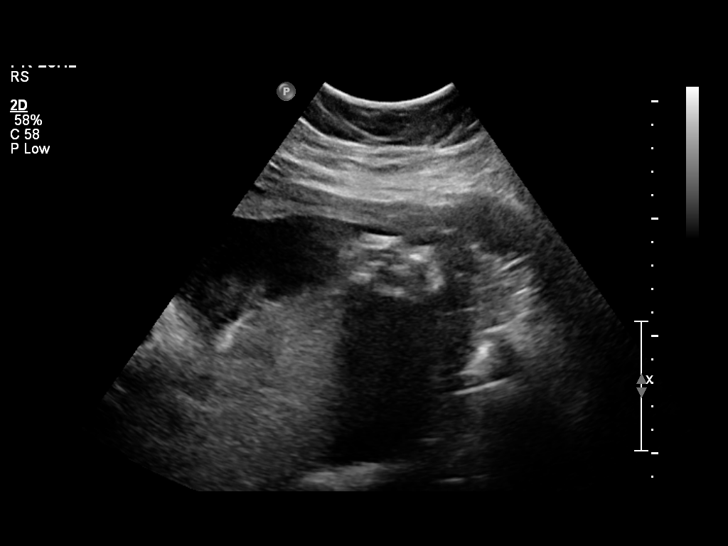
[im 8/64]
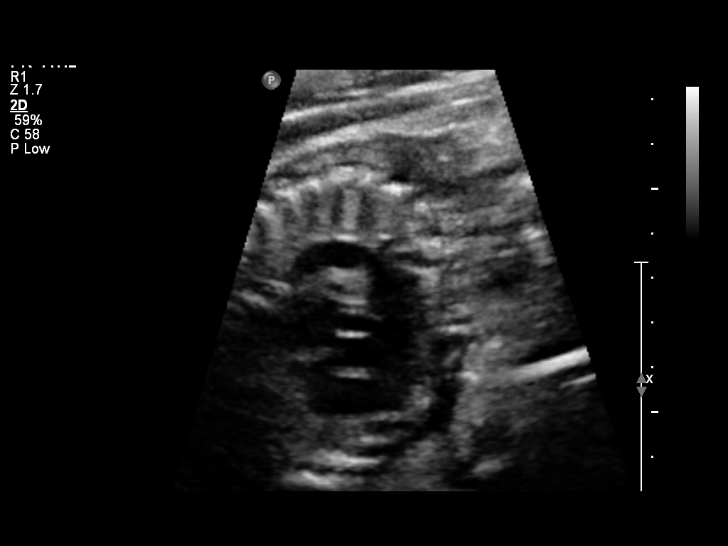
[im 12/64]
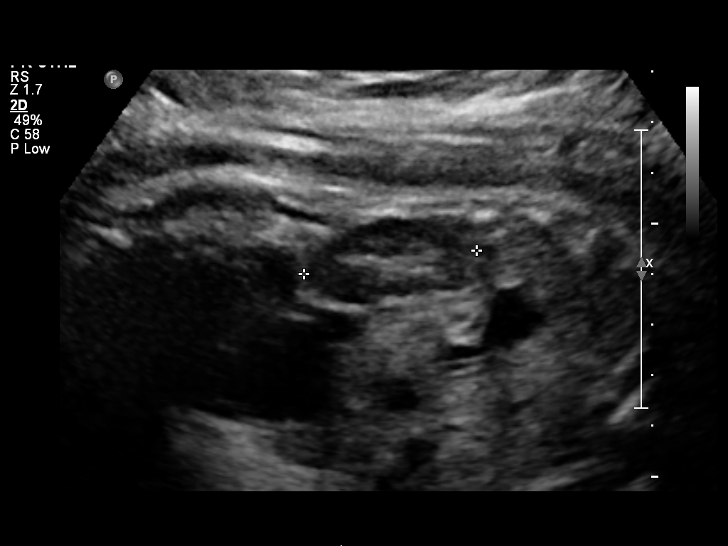
[im 19/64]
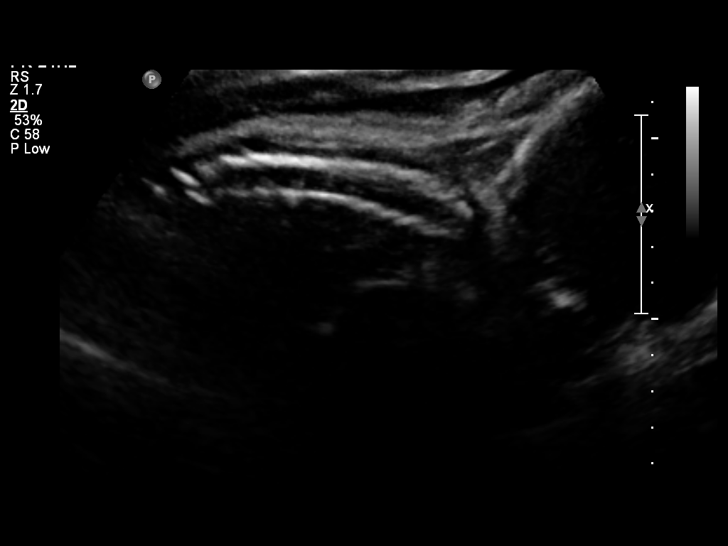
[im 24/64]
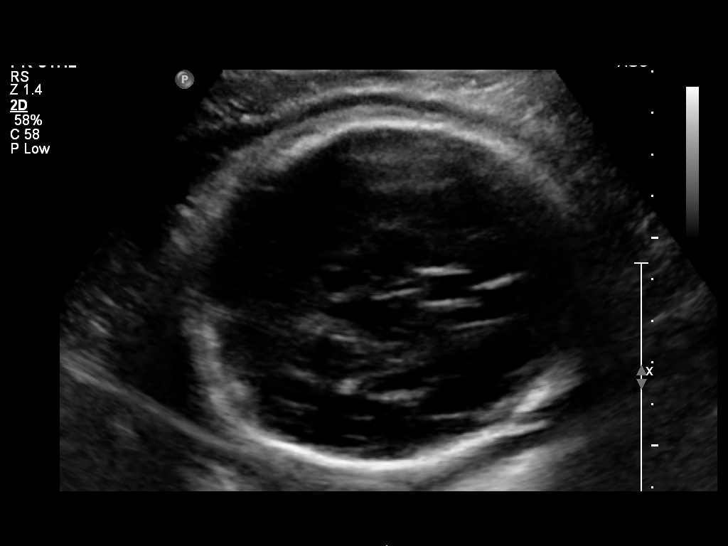
[im 29/64]
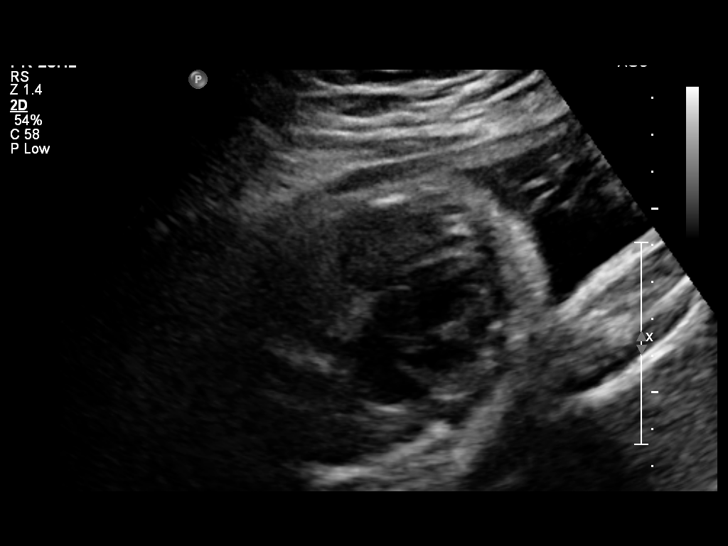
[im 36/64]
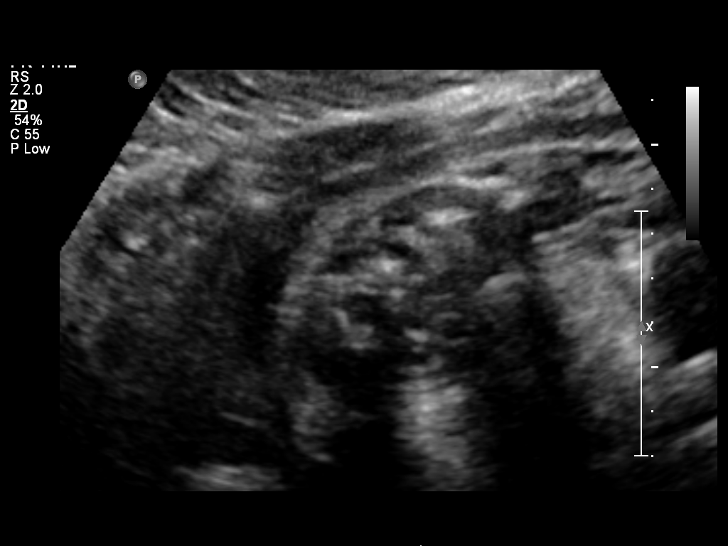
[im 40/64]
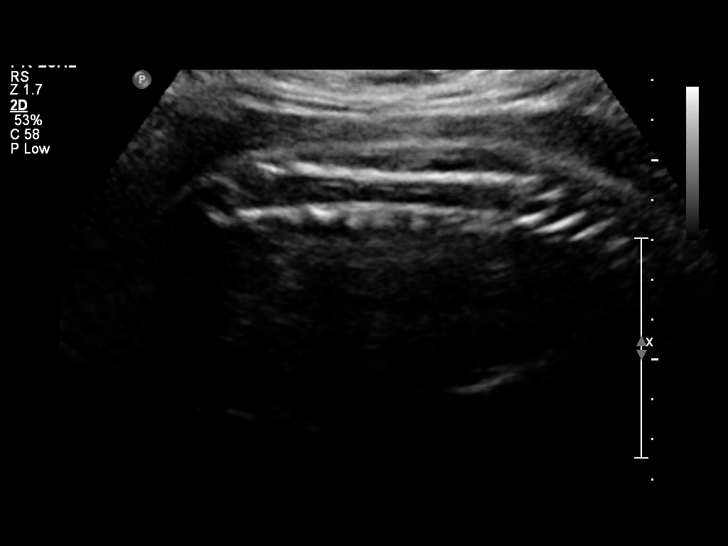
[im 45/64]
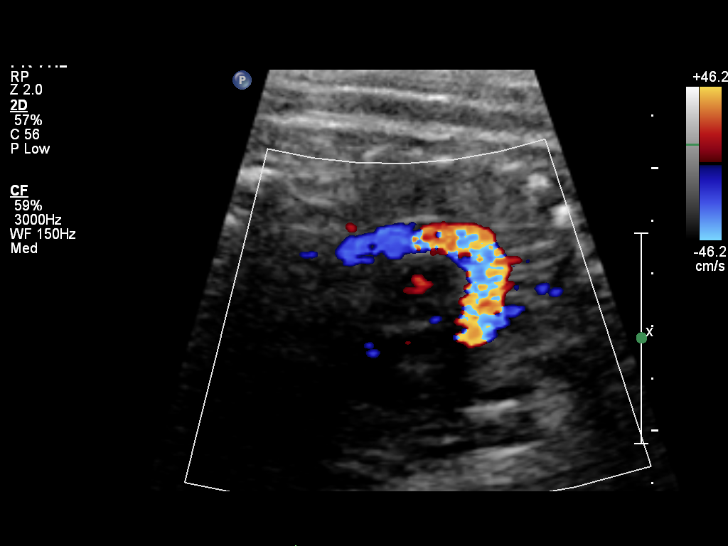
[im 52/64]
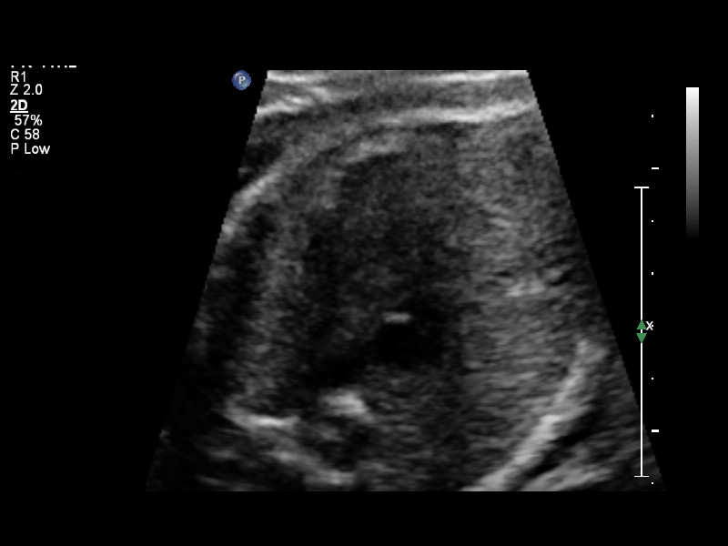
[im 57/64]
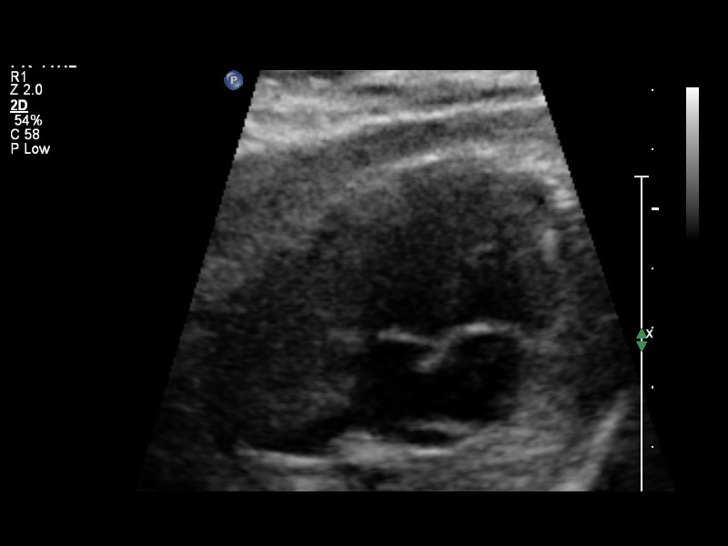
[im 61/64]
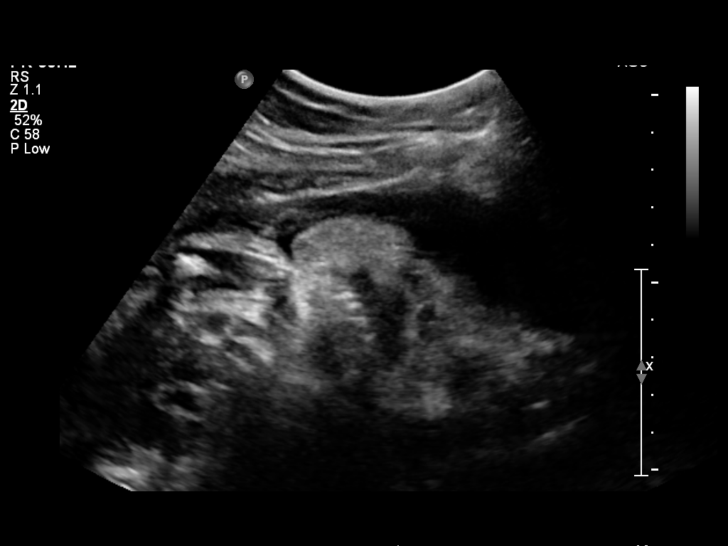

[12 of 28 positions shown; findings below may reference images not displayed]

OBSTETRICS REPORT
                      (Signed Final 04/15/2014 [DATE])

Service(s) Provided

 US OB FOLLOW UP                                       76816.1
Indications

 Maternal morbid obesity
Fetal Evaluation

 Num Of Fetuses:    1
 Fetal Heart Rate:  144                          bpm
 Cardiac Activity:  Observed
 Presentation:      Cephalic
 Placenta:          Posterior, above cervical
                    os
 P. Cord            Previously Visualized
 Insertion:

 Amniotic Fluid
 AFI FV:      Subjectively within normal limits
 AFI Sum:     15.34   cm       54  %Tile     Larg Pckt:    5.97  cm
 RUQ:   3       cm   RLQ:    2.57   cm    LUQ:   5.97    cm   LLQ:    3.8    cm
Biometry

 BPD:       82  mm     G. Age:  33w 0d                CI:        74.74   70 - 86
                                                      FL/HC:      21.5   19.1 -

 HC:       301  mm     G. Age:  33w 3d       54  %    HC/AC:      1.04   0.96 -

 AC:     289.2  mm     G. Age:  32w 6d       78  %    FL/BPD:     78.8   71 - 87
 FL:      64.6  mm     G. Age:  33w 2d       77  %    FL/AC:      22.3   20 - 24

 Est. FW:    7441  gm    4 lb 11 oz      77  %
Gestational Age

 LMP:           31w 6d        Date:  09/04/13                 EDD:   06/11/14
 U/S Today:     33w 1d                                        EDD:   06/02/14
 Best:          31w 6d     Det. By:  LMP  (09/04/13)          EDD:   06/11/14
Anatomy

 Cranium:          Appears normal         Aortic Arch:      Appears normal
 Fetal Cavum:      Appears normal         Ductal Arch:      Appears normal
 Ventricles:       Appears normal         Diaphragm:        Appears normal
 Choroid Plexus:   Previously seen        Stomach:          Appears normal, left
                                                            sided
 Cerebellum:       Previously seen        Abdomen:          Appears normal
 Posterior Fossa:  Previously seen        Abdominal Wall:   Previously seen
 Nuchal Fold:      Not applicable (>20    Cord Vessels:     Previously seen
                   wks GA)
 Face:             Orbits and profile     Kidneys:          Appear normal
                   previously seen
 Lips:             Appears normal         Bladder:          Appears normal
 Heart:            Appears normal         Spine:            Appears normal
                   (4CH, axis, and
                   situs)
 RVOT:             Appears normal         Lower             Previously seen
                                          Extremities:
 LVOT:             Appears normal         Upper             Previously seen
                                          Extremities:

 Other:  Parents do not wish to know sex of fetus. Fetus appears to be a
         female. Technically difficult due to maternal habitus and fetal position.
Cervix Uterus Adnexa

 Cervix:       Not visualized (advanced GA >02wks)
Impression

 SIUP at 31+6 weeks
 Normal interval anatomy; anatomic survey complete
 Normal amniotic fluid volume
 Appropriate interval growth with EFW at the 77th %tile
Recommendations

 Follow-up ultrasound for growth in 4 weeks

## 2019-04-10 ENCOUNTER — Ambulatory Visit (INDEPENDENT_AMBULATORY_CARE_PROVIDER_SITE_OTHER): Payer: 59 | Admitting: Family Medicine

## 2019-04-10 ENCOUNTER — Encounter: Payer: Self-pay | Admitting: Family Medicine

## 2019-04-10 VITALS — BP 128/86 | HR 83 | Temp 98.0°F | Ht 64.0 in | Wt 298.6 lb

## 2019-04-10 DIAGNOSIS — J302 Other seasonal allergic rhinitis: Secondary | ICD-10-CM | POA: Diagnosis not present

## 2019-04-10 DIAGNOSIS — Z0000 Encounter for general adult medical examination without abnormal findings: Secondary | ICD-10-CM | POA: Diagnosis not present

## 2019-04-10 DIAGNOSIS — E559 Vitamin D deficiency, unspecified: Secondary | ICD-10-CM

## 2019-04-10 LAB — BASIC METABOLIC PANEL
BUN: 16 mg/dL (ref 6–23)
CO2: 26 mEq/L (ref 19–32)
Calcium: 9.6 mg/dL (ref 8.4–10.5)
Chloride: 101 mEq/L (ref 96–112)
Creatinine, Ser: 0.85 mg/dL (ref 0.40–1.20)
GFR: 92.37 mL/min (ref >60.00)
Glucose, Bld: 86 mg/dL (ref 70–99)
Potassium: 3.6 mEq/L (ref 3.5–5.1)
Sodium: 136 mEq/L (ref 135–145)

## 2019-04-10 LAB — LIPID PANEL
Cholesterol: 119 mg/dL (ref 0–200)
HDL: 45.3 mg/dL (ref >39.00)
LDL Cholesterol: 61 mg/dL (ref 0–99)
NonHDL: 73.78
Total CHOL/HDL Ratio: 3
Triglycerides: 63 mg/dL (ref 0.0–149.0)
VLDL: 12.6 mg/dL (ref 0.0–40.0)

## 2019-04-10 LAB — AST: AST: 19 U/L (ref 0–37)

## 2019-04-10 LAB — ALT: ALT: 18 U/L (ref 0–35)

## 2019-04-10 LAB — VITAMIN D 25 HYDROXY (VIT D DEFICIENCY, FRACTURES): VITD: 60.4 ng/mL (ref 30.00–100.00)

## 2019-04-10 NOTE — Progress Notes (Signed)
Tracey Bean is a 33 y.o. female  Chief Complaint  Patient presents with  . Establish Care    est care/ CPE- fasting    HPI: Tracey Bean is a 33 y.o. female here for CPE, fasting labs. She is new to our office. Her previous PCP was thru [place].  Pt takes zyrtec 2 tabs daily for her seasonal allergies since 12/2018. She was on claritin in the past for a few months. She is not on nasal steroid spray and does not use saline or neti pot.   Last PAP: 2019 - normal (done with PCP). 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 none   Past Medical History:  Diagnosis Date  . Medical history non-contributory   . PONV (postoperative nausea and vomiting)     Past Surgical History:  Procedure Laterality Date  . CESAREAN SECTION    . CESAREAN SECTION N/A 06/02/2014   Procedure: Primary Cesarean Section Delivery Baby Girl @ [facility],;  Surgeon: Jonnie Kind, MD;  Location: [facility];  Service: Obstetrics;  Laterality: N/A;    Social History   Socioeconomic History  . Marital status: Married    Spouse name: Not on file  . Number of children: Not on file  . Years of education: Not on file  . Highest education level: Not on file  Occupational History  . Not on file  Social Needs  . Financial resource strain: Not on file  . Food insecurity    Worry: Not on file    Inability: Not on file  . Transportation needs    Medical: Not on file    Non-medical: Not on file  Tobacco Use  . Smoking status: Never Smoker  . Smokeless tobacco: Never Used  Substance and Sexual Activity  . Alcohol use: No  . Drug use: No  . Sexual activity: Not Currently    Birth control/protection: None  Lifestyle  . Physical activity    Days per week: Not on file    Minutes per session: Not on file  . Stress: Not on file  Relationships  . Social Herbalist on phone:  Not on file    Gets together: Not on file    Attends religious service: Not on file    Active member of club or organization: Not on file    Attends meetings of clubs or organizations: Not on file    Relationship status: Not on file  . Intimate partner violence    Fear of current or ex partner: Not on file    Emotionally abused: Not on file    Physically abused: Not on file    Forced sexual activity: Not on file  Other Topics Concern  . Not on file  Social History Narrative  . Not on file    Family History  Problem Relation Age of Onset  . Diabetes Mother   . Diabetes Father   . Stroke Father   . Asthma Sister      Immunization History  Administered Date(s) Administered  . HPV Quadrivalent 10/27/2010, 12/08/2010, 04/13/2011  . Hepatitis B, ped/adol 06/03/1996, 07/08/1996, 11/30/1996  . Influenza,inj,Quad PF,6+ Mos 05/05/2014  . PPD Test 01/27/2018  . Td 08/27/2000  . Tdap 05/20/2009, 03/10/2014    Outpatient Encounter Medications as of 04/10/2019  Medication Sig  . levonorgestrel (MIRENA) 20 MCG/24HR IUD  by Intrauterine route.  . Multiple Vitamins-Minerals (THERA-M) TABS Take by mouth.   No facility-administered encounter medications on file as of 04/10/2019.      ROS: Gen: no fever, chills  Skin: no rash, itching ENT: as above in HPI Resp: no cough, wheeze,SOB Breast: no breast tenderness, no nipple discharge, no breast masses CV: no CP, palpitations, LE edema,  GI: no heartburn, n/v/d/c, abd pain GU: no dysuria, urgency, frequency, hematuria; no vaginal itching, odor, discharge MSK: no joint pain, myalgias, back pain Neuro: no dizziness, headache, weakness, vertigo Psych: no depression, anxiety, insomnia   Allergies  Allergen Reactions  . Eggs Or Egg-Derived Products Swelling    BP 128/86   Pulse 83   Temp 98 F (36.7 C) (Oral)   Ht 5\' 4"  (1.626 m)   Wt 298 lb 9.6 oz (135.4 kg)   SpO2 99%   BMI 51.25 kg/m   Physical Exam  Constitutional: She  is oriented to person, place, and time. She appears well-developed and well-nourished. No distress.  HENT:  Head: Normocephalic and atraumatic.  Right Ear: Tympanic membrane and ear canal normal.  Left Ear: Tympanic membrane and ear canal normal.  Nose: Nose normal.  Mouth/Throat: Oropharynx is clear and moist and mucous membranes are normal.  Eyes: Pupils are equal, round, and reactive to light. Conjunctivae are normal.  Neck: Neck supple. No thyromegaly present.  Cardiovascular: Normal rate, regular rhythm, normal heart sounds and intact distal pulses.  No murmur heard. Pulmonary/Chest: Effort normal and breath sounds normal. No respiratory distress. She has no wheezes. She has no rhonchi. Right breast exhibits no mass, no nipple discharge, no skin change and no tenderness. Left breast exhibits no mass, no nipple discharge, no skin change and no tenderness.  Abdominal: Soft. Bowel sounds are normal. She exhibits no distension and no mass. There is no abdominal tenderness.  Musculoskeletal:        General: No edema.  Lymphadenopathy:    She has no cervical adenopathy.  Neurological: She is alert and oriented to person, place, and time. She exhibits normal muscle tone. Coordination normal.  Skin: Skin is warm and dry.  Psychiatric: She has a normal mood and affect. Her behavior is normal.     A/P:  1. Annual physical exam - UTD on immunizations - due for dental and vision exams, pt to schedule - discussed importance of regular CV exercise, healthy diet, adequate sleep - ALT - AST - Basic metabolic panel - VITAMIN D 25 Hydroxy (Vit-D Deficiency, Fractures) - Lipid panel - next CPE in 1 year  2. Avitaminosis D - VITAMIN D 25 Hydroxy (Vit-D Deficiency, Fractures)  3. Seasonal allergies - nasal saline spray TID - zyrtec 1 tab daily - flonase 2 sprays each nostril daily - f/u in 2-3 wks if minimal/no improvement  Discussed plan and reviewed medications with patient, including  risks, benefits, and potential side effects. Pt expressed understand. All questions answered.

## 2019-04-10 NOTE — Patient Instructions (Addendum)
Zyrtec 1 tab daily flonase 2 sprays each nostril daily Try nasal saline spray 2-3x/day  Health Maintenance, Female Adopting a healthy lifestyle and getting preventive care are important in promoting health and wellness. Ask your health care provider about:  The right schedule for you to have regular tests and exams.  Things you can do on your own to prevent diseases and keep yourself healthy. What should I know about diet, weight, and exercise? Eat a healthy diet   Eat a diet that includes plenty of vegetables, fruits, low-fat dairy products, and lean protein.  Do not eat a lot of foods that are high in solid fats, added sugars, or sodium. Maintain a healthy weight Body mass index (BMI) is used to identify weight problems. It estimates body fat based on height and weight. Your health care provider can help determine your BMI and help you achieve or maintain a healthy weight. Get regular exercise Get regular exercise. This is one of the most important things you can do for your health. Most adults should:  Exercise for at least 150 minutes each week. The exercise should increase your heart rate and make you sweat (moderate-intensity exercise).  Do strengthening exercises at least twice a week. This is in addition to the moderate-intensity exercise.  Spend less time sitting. Even light physical activity can be beneficial. Watch cholesterol and blood lipids Have your blood tested for lipids and cholesterol at 34 years of age, then have this test every 5 years. Have your cholesterol levels checked more often if:  Your lipid or cholesterol levels are high.  You are older than 33 years of age.  You are at high risk for heart disease. What should I know about cancer screening? Depending on your health history and family history, you may need to have cancer screening at various ages. This may include screening for:  Breast cancer.  Cervical cancer.  Colorectal cancer.  Skin  cancer.  Lung cancer. What should I know about heart disease, diabetes, and high blood pressure? Blood pressure and heart disease  High blood pressure causes heart disease and increases the risk of stroke. This is more likely to develop in people who have high blood pressure readings, are of African descent, or are overweight.  Have your blood pressure checked: ? Every 3-5 years if you are 32-58 years of age. ? Every year if you are 70 years old or older. Diabetes Have regular diabetes screenings. This checks your fasting blood sugar level. Have the screening done:  Once every three years after age 72 if you are at a normal weight and have a low risk for diabetes.  More often and at a younger age if you are overweight or have a high risk for diabetes. What should I know about preventing infection? Hepatitis B If you have a higher risk for hepatitis B, you should be screened for this virus. Talk with your health care provider to find out if you are at risk for hepatitis B infection. Hepatitis C Testing is recommended for:  Everyone born from [birth year] through 1966.  Anyone with known risk factors for hepatitis C. Sexually transmitted infections (STIs)  Get screened for STIs, including gonorrhea and chlamydia, if: ? You are sexually active and are younger than 34 years of age. ? You are older than 34 years of age and your health care provider tells you that you are at risk for this type of infection. ? Your sexual activity has changed since you were last screened,  and you are at increased risk for chlamydia or gonorrhea. Ask your health care provider if you are at risk.  Ask your health care provider about whether you are at high risk for HIV. Your health care provider may recommend a prescription medicine to help prevent HIV infection. If you choose to take medicine to prevent HIV, you should first get tested for HIV. You should then be tested every 3 months for as long as you are taking  the medicine. Pregnancy  If you are about to stop having your period (premenopausal) and you may become pregnant, seek counseling before you get pregnant.  Take 400 to 800 micrograms (mcg) of folic acid every day if you become pregnant.  Ask for birth control (contraception) if you want to prevent pregnancy. Osteoporosis and menopause Osteoporosis is a disease in which the bones lose minerals and strength with aging. This can result in bone fractures. If you are 34 years old or older, or if you are at risk for osteoporosis and fractures, ask your health care provider if you should:  Be screened for bone loss.  Take a calcium or vitamin D supplement to lower your risk of fractures.  Be given hormone replacement therapy (HRT) to treat symptoms of menopause. Follow these instructions at home: Lifestyle  Do not use any products that contain nicotine or tobacco, such as cigarettes, e-cigarettes, and chewing tobacco. If you need help quitting, ask your health care provider.  Do not use street drugs.  Do not share needles.  Ask your health care provider for help if you need support or information about quitting drugs. Alcohol use  Do not drink alcohol if: ? Your health care provider tells you not to drink. ? You are pregnant, may be pregnant, or are planning to become pregnant.  If you drink alcohol: ? Limit how much you use to 0-1 drink a day. ? Limit intake if you are breastfeeding.  Be aware of how much alcohol is in your drink. In the [place] one drink equals one 12 oz bottle of beer (355 mL), one 5 oz glass of wine (148 mL), or one 1 oz glass of hard liquor (44 mL). General instructions  Schedule regular health, dental, and eye exams.  Stay current with your vaccines.  Tell your health care provider if: ? You often feel depressed. ? You have ever been abused or do not feel safe at home. Summary  Adopting a healthy lifestyle and getting preventive care are important in  promoting health and wellness.  Follow your health care provider's instructions about healthy diet, exercising, and getting tested or screened for diseases.  Follow your health care provider's instructions on monitoring your cholesterol and blood pressure. This information is not intended to replace advice given to you by your health care provider. Make sure you discuss any questions you have with your health care provider. Document Released: 02/26/2011 Document Revised: 08/06/2018 Document Reviewed: 08/06/2018 Elsevier Patient Education  2020 [employer]

## 2019-10-14 ENCOUNTER — Encounter: Payer: Self-pay | Admitting: Family Medicine

## 2020-06-30 ENCOUNTER — Ambulatory Visit (INDEPENDENT_AMBULATORY_CARE_PROVIDER_SITE_OTHER): Payer: 59 | Admitting: Family Medicine

## 2020-06-30 ENCOUNTER — Encounter: Payer: Self-pay | Admitting: Family Medicine

## 2020-06-30 ENCOUNTER — Other Ambulatory Visit: Payer: Self-pay

## 2020-06-30 VITALS — BP 118/70 | HR 99 | Temp 97.0°F | Ht 64.0 in | Wt 298.6 lb

## 2020-06-30 DIAGNOSIS — Z0000 Encounter for general adult medical examination without abnormal findings: Secondary | ICD-10-CM | POA: Diagnosis not present

## 2020-06-30 DIAGNOSIS — E559 Vitamin D deficiency, unspecified: Secondary | ICD-10-CM

## 2020-06-30 LAB — BASIC METABOLIC PANEL
BUN: 12 mg/dL (ref 6–23)
CO2: 27 mEq/L (ref 19–32)
Calcium: 9 mg/dL (ref 8.4–10.5)
Chloride: 103 mEq/L (ref 96–112)
Creatinine, Ser: 0.93 mg/dL (ref 0.40–1.20)
GFR: 79.52 mL/min (ref >60.00)
Glucose, Bld: 95 mg/dL (ref 70–99)
Potassium: 4.2 mEq/L (ref 3.5–5.1)
Sodium: 136 mEq/L (ref 135–145)

## 2020-06-30 LAB — LIPID PANEL
Cholesterol: 108 mg/dL (ref 0–200)
HDL: 38.4 mg/dL — ABNORMAL LOW (ref >39.00)
LDL Cholesterol: 58 mg/dL (ref 0–99)
NonHDL: 69.6
Total CHOL/HDL Ratio: 3
Triglycerides: 58 mg/dL (ref 0.0–149.0)
VLDL: 11.6 mg/dL (ref 0.0–40.0)

## 2020-06-30 LAB — CBC
HCT: 36.3 % (ref 36.0–46.0)
Hemoglobin: 11.9 g/dL — ABNORMAL LOW (ref 12.0–15.0)
MCHC: 32.8 g/dL (ref 30.0–36.0)
MCV: 82.8 fl (ref 78.0–100.0)
Platelets: 231 10*3/uL (ref 150.0–400.0)
RBC: 4.38 Mil/uL (ref 3.87–5.11)
RDW: 14.7 % (ref 11.5–15.5)
WBC: 6.1 10*3/uL (ref 4.0–10.5)

## 2020-06-30 LAB — ALT: ALT: 14 U/L (ref 0–35)

## 2020-06-30 LAB — AST: AST: 15 U/L (ref 0–37)

## 2020-06-30 LAB — VITAMIN D 25 HYDROXY (VIT D DEFICIENCY, FRACTURES): VITD: 51.15 ng/mL (ref 30.00–100.00)

## 2020-06-30 NOTE — Progress Notes (Signed)
Tracey Bean is a 34 y.o. female  Chief Complaint  Patient presents with  . Annual Exam    CPE/pap/labs.  fasting this am.       HPI: Tracey Bean is a 34 y.o. female seen today for annual CPE, fasting labs. Pt declines PAP today and states she will see GYN for this and to have mirena replaced.  Last PAP: 2019 - normal. 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 no   Past Medical History:  Diagnosis Date  . Allergy   . Medical history non-contributory   . PONV (postoperative nausea and vomiting)     Past Surgical History:  Procedure Laterality Date  . CESAREAN SECTION    . CESAREAN SECTION N/A 06/02/2014   Procedure: Primary Cesarean Section Delivery Baby Girl @ 1834,;  Surgeon: Tilda Burrow, MD;  Location: [facility];  Service: Obstetrics;  Laterality: N/A;    Social History   Socioeconomic History  . Marital status: Married    Spouse name: Not on file  . Number of children: Not on file  . Years of education: Not on file  . Highest education level: Not on file  Occupational History  . Not on file  Tobacco Use  . Smoking status: Never Smoker  . Smokeless tobacco: Never Used  Vaping Use  . Vaping Use: Never used  Substance and Sexual Activity  . Alcohol use: No  . Drug use: No  . Sexual activity: Not Currently    Birth control/protection: None  Other Topics Concern  . Not on file  Social History Narrative  . Not on file   Social Determinants of Health   Financial Resource Strain:   . Difficulty of Paying Living Expenses: Not on file  Food Insecurity:   . Worried About Programme researcher, broadcasting/film/video in the Last Year: Not on file  . Ran Out of Food in the Last Year: Not on file  Transportation Needs:   . Lack of Transportation (Medical): Not on file  . Lack of Transportation (Non-Medical): Not on file  Physical Activity:   .  Days of Exercise per Week: Not on file  . Minutes of Exercise per Session: Not on file  Stress:   . Feeling of Stress : Not on file  Social Connections:   . Frequency of Communication with Friends and Family: Not on file  . Frequency of Social Gatherings with Friends and Family: Not on file  . Attends Religious Services: Not on file  . Active Member of Clubs or Organizations: Not on file  . Attends Banker Meetings: Not on file  . Marital Status: Not on file  Intimate Partner Violence:   . Fear of Current or Ex-Partner: Not on file  . Emotionally Abused: Not on file  . Physically Abused: Not on file  . Sexually Abused: Not on file    Family History  Problem Relation Age of Onset  . Diabetes Mother   . Diabetes Father   . Stroke Father   . Asthma Sister      Immunization History  Administered Date(s) Administered  . HPV Quadrivalent 10/27/2010, 12/08/2010, 04/13/2011  . Hepatitis B, ped/adol 06/03/1996, 07/08/1996, 11/30/1996  . Influenza,inj,Quad PF,6+ Mos 05/05/2014  . Influenza-Unspecified 06/21/2020  . PFIZER SARS-COV-2 Vaccination 04/27/2020, 06/10/2020  . PPD Test 01/27/2018  . Td 08/27/2000  .  Tdap 05/20/2009, 03/10/2014    Outpatient Encounter Medications as of 06/30/2020  Medication Sig  . Ascorbic Acid (VITAMIN C) 100 MG tablet Take by mouth.  . cetirizine (ZYRTEC) 10 MG tablet Take 10 mg by mouth daily.  . cholecalciferol (VITAMIN D3) 25 MCG (1000 UNIT) tablet Take 1,000 Units by mouth daily.  Marland Kitchen levonorgestrel (MIRENA) 20 MCG/24HR IUD by Intrauterine route.  . Multiple Vitamin (MULTIVITAMIN ADULT) TABS Take by mouth.  . Multiple Vitamins-Minerals (THERA-M) TABS Take by mouth.   No facility-administered encounter medications on file as of 06/30/2020.     ROS: Gen: no fever, chills  Skin: no rash, itching ENT: no ear pain, ear drainage, nasal congestion, rhinorrhea, sinus pressure, sore throat Eyes: no blurry vision, double vision Resp: no  cough, wheeze,SOB Breast: no breast tenderness, no nipple discharge, no breast masses CV: no CP, palpitations, LE edema,  GI: no heartburn, n/v/d/c, abd pain GU: no dysuria, urgency, frequency, hematuria; no vaginal itching, odor, discharge MSK: no joint pain, myalgias, back pain Neuro: no dizziness, headache, weakness, vertigo Psych: no depression, anxiety, insomnia   Allergies  Allergen Reactions  . Eggs Or Egg-Derived Products Swelling    BP 118/70   Pulse 99   Temp (!) 97 F (36.1 C) (Temporal)   Ht 5\' 4"  (1.626 m)   Wt 298 lb 9.6 oz (135.4 kg)   SpO2 98%   BMI 51.25 kg/m   Physical Exam Constitutional:      General: She is not in acute distress.    Appearance: She is well-developed.  HENT:     Head: Normocephalic and atraumatic.     Right Ear: Tympanic membrane and ear canal normal.     Left Ear: Tympanic membrane and ear canal normal.     Nose: Nose normal.  Eyes:     Conjunctiva/sclera: Conjunctivae normal.     Pupils: Pupils are equal, round, and reactive to light.  Neck:     Thyroid: No thyromegaly.  Cardiovascular:     Rate and Rhythm: Normal rate and regular rhythm.     Heart sounds: Normal heart sounds. No murmur heard.   Pulmonary:     Effort: Pulmonary effort is normal. No respiratory distress.     Breath sounds: Normal breath sounds. No wheezing or rhonchi.  Abdominal:     General: Bowel sounds are normal. There is no distension.     Palpations: Abdomen is soft. There is no mass.     Tenderness: There is no abdominal tenderness.  Musculoskeletal:     Cervical back: Neck supple.  Lymphadenopathy:     Cervical: No cervical adenopathy.  Skin:    General: Skin is warm and dry.  Neurological:     Mental Status: She is alert and oriented to person, place, and time.     Motor: No abnormal muscle tone.     Coordination: Coordination normal.  Psychiatric:        Behavior: Behavior normal.      A/P:  1. Annual physical exam - discussed  importance of regular CV exercise, healthy diet, adequate sleep - UTD on vision exam, due for dental - PAP is due but pt declines today and prefers to schedule with GYN for this as well as IUD removal/replacement - discussed importance of regular CV exercise, healthy diet, adequate sleep. Pt states her diet had not ben very good and she was not exercising, but recently started to get back on tract - CBC - Basic metabolic panel - ALT -  AST - Lipid panel - next CPE in 1 year  2. Avitaminosis D - VITAMIN D 25 Hydroxy (Vit-D Deficiency, Fractures)   This visit occurred during the SARS-CoV-2 public health emergency.  Safety protocols were in place, including screening questions prior to the visit, additional usage of staff PPE, and extensive cleaning of exam room while observing appropriate contact time as indicated for disinfecting solutions.

## 2020-06-30 NOTE — Patient Instructions (Addendum)
[facility]  [email or web address] [address], [address] [place] [address] P: [phone] F: [phone] [email or web address]  [facility] [email or web address] [address] [place], [place] [address] fax: [phone] [email or web address]   [facility] [address] She[place] Maroon [place], [place] [address] [phone] [email or web address]  [facility] [address] [place], [place] [address] [phone] [email or web address]    Health Maintenance, Female Adopting a healthy lifestyle and getting preventive care are important in promoting health and wellness. Ask your health care provider about:  The right schedule for you to have regular tests and exams.  Things you can do on your own to prevent diseases and keep yourself healthy. What should I know about diet, weight, and exercise? Eat a healthy diet   Eat a diet that includes plenty of vegetables, fruits, low-fat dairy products, and lean protein.  Do not eat a lot of foods that are high in solid fats, added sugars, or sodium. Maintain a healthy weight Body mass index (BMI) is used to identify weight problems. It estimates body fat based on height and weight. Your health care provider can help determine your BMI and help you achieve or maintain a healthy weight. Get regular exercise Get regular exercise. This is one of the most important things you can do for your health. Most adults should:  Exercise for at least 150 minutes each week. The exercise should increase your heart rate and make you sweat (moderate-intensity exercise).  Do strengthening exercises at least twice a week. This is in addition to the moderate-intensity exercise.  Spend less time sitting. Even light physical activity can be beneficial. Watch cholesterol and blood lipids Have your blood tested for lipids and cholesterol at 35 years of age, then have  this test every 5 years. Have your cholesterol levels checked more often if:  Your lipid or cholesterol levels are high.  You are older than 35 years of age.  You are at high risk for heart disease. What should I know about cancer screening? Depending on your health history and family history, you may need to have cancer screening at various ages. This may include screening for:  Breast cancer.  Cervical cancer.  Colorectal cancer.  Skin cancer.  Lung cancer. What should I know about heart disease, diabetes, and high blood pressure? Blood pressure and heart disease  High blood pressure causes heart disease and increases the risk of stroke. This is more likely to develop in people who have high blood pressure readings, are of African descent, or are overweight.  Have your blood pressure checked: ? Every 3-5 years if you are 38-61 years of age. ? Every year if you are 47 years old or older. Diabetes Have regular diabetes screenings. This checks your fasting blood sugar level. Have the screening done:  Once every three years after age 78 if you are at a normal weight and have a low risk for diabetes.  More often and at a younger age if you are overweight or have a high risk for diabetes. What should I know about preventing infection? Hepatitis B If you have a higher risk for hepatitis B, you should be screened for this virus. Talk with your health care provider to find out if you are at risk for hepatitis B infection. Hepatitis C Testing is recommended for:  Everyone born from [birth year] through 1966.  Anyone with known risk factors for hepatitis C. Sexually transmitted infections (STIs)  Get screened for STIs, including gonorrhea and  chlamydia, if: ? You are sexually active and are younger than 35 years of age. ? You are older than 35 years of age and your health care provider tells you that you are at risk for this type of infection. ? Your sexual activity has changed since  you were last screened, and you are at increased risk for chlamydia or gonorrhea. Ask your health care provider if you are at risk.  Ask your health care provider about whether you are at high risk for HIV. Your health care provider may recommend a prescription medicine to help prevent HIV infection. If you choose to take medicine to prevent HIV, you should first get tested for HIV. You should then be tested every 3 months for as long as you are taking the medicine. Pregnancy  If you are about to stop having your period (premenopausal) and you may become pregnant, seek counseling before you get pregnant.  Take 400 to 800 micrograms (mcg) of folic acid every day if you become pregnant.  Ask for birth control (contraception) if you want to prevent pregnancy. Osteoporosis and menopause Osteoporosis is a disease in which the bones lose minerals and strength with aging. This can result in bone fractures. If you are 63 years old or older, or if you are at risk for osteoporosis and fractures, ask your health care provider if you should:  Be screened for bone loss.  Take a calcium or vitamin D supplement to lower your risk of fractures.  Be given hormone replacement therapy (HRT) to treat symptoms of menopause. Follow these instructions at home: Lifestyle  Do not use any products that contain nicotine or tobacco, such as cigarettes, e-cigarettes, and chewing tobacco. If you need help quitting, ask your health care provider.  Do not use street drugs.  Do not share needles.  Ask your health care provider for help if you need support or information about quitting drugs. Alcohol use  Do not drink alcohol if: ? Your health care provider tells you not to drink. ? You are pregnant, may be pregnant, or are planning to become pregnant.  If you drink alcohol: ? Limit how much you use to 0-1 drink a day. ? Limit intake if you are breastfeeding.  Be aware of how much alcohol is in your drink. In  the [place] one drink equals one 12 oz bottle of beer (355 mL), one 5 oz glass of wine (148 mL), or one 1 oz glass of hard liquor (44 mL). General instructions  Schedule regular health, dental, and eye exams.  Stay current with your vaccines.  Tell your health care provider if: ? You often feel depressed. ? You have ever been abused or do not feel safe at home. Summary  Adopting a healthy lifestyle and getting preventive care are important in promoting health and wellness.  Follow your health care provider's instructions about healthy diet, exercising, and getting tested or screened for diseases.  Follow your health care provider's instructions on monitoring your cholesterol and blood pressure. This information is not intended to replace advice given to you by your health care provider. Make sure you discuss any questions you have with your health care provider. Document Revised: 08/06/2018 Document Reviewed: 08/06/2018 Elsevier Patient Education  2020 [employer].

## 2020-07-01 ENCOUNTER — Encounter: Payer: Self-pay | Admitting: Family Medicine

## 2021-02-16 ENCOUNTER — Telehealth: Payer: Self-pay | Admitting: Family Medicine

## 2021-02-16 NOTE — Telephone Encounter (Signed)
I called pt about message sent in:  Appointment Request From: Collie Siad    With Provider: Luana Shu, DO [[facility]]    Preferred Date Range: 06/26/2021 - 06/30/2021    Preferred Times: Any Time    Reason for visit: Annual Physical    Comments:  N/A    I let her know about Dr Barron Alvine leaving and she said she is going to look into who she wants to transfer to and call us back

## 2021-04-14 DIAGNOSIS — Z6841 Body Mass Index (BMI) 40.0 and over, adult: Secondary | ICD-10-CM | POA: Diagnosis not present

## 2021-04-14 DIAGNOSIS — Z130 Encounter for screening for diseases of the blood and blood-forming organs and certain disorders involving the immune mechanism: Secondary | ICD-10-CM | POA: Diagnosis not present

## 2021-04-14 DIAGNOSIS — Z01419 Encounter for gynecological examination (general) (routine) without abnormal findings: Secondary | ICD-10-CM | POA: Diagnosis not present

## 2021-04-14 DIAGNOSIS — Z1389 Encounter for screening for other disorder: Secondary | ICD-10-CM | POA: Diagnosis not present

## 2021-04-14 DIAGNOSIS — Z124 Encounter for screening for malignant neoplasm of cervix: Secondary | ICD-10-CM | POA: Diagnosis not present

## 2021-04-14 DIAGNOSIS — Z1151 Encounter for screening for human papillomavirus (HPV): Secondary | ICD-10-CM | POA: Diagnosis not present

## 2021-04-14 LAB — RESULTS CONSOLE HPV: CHL HPV: NEGATIVE

## 2021-04-14 LAB — HM PAP SMEAR

## 2021-04-24 DIAGNOSIS — Z30433 Encounter for removal and reinsertion of intrauterine contraceptive device: Secondary | ICD-10-CM | POA: Diagnosis not present

## 2021-06-02 DIAGNOSIS — Z30431 Encounter for routine checking of intrauterine contraceptive device: Secondary | ICD-10-CM | POA: Diagnosis not present

## 2021-12-18 ENCOUNTER — Ambulatory Visit (INDEPENDENT_AMBULATORY_CARE_PROVIDER_SITE_OTHER): Payer: 59 | Admitting: Family Medicine

## 2021-12-18 ENCOUNTER — Encounter: Payer: Self-pay | Admitting: Family Medicine

## 2021-12-18 VITALS — BP 127/75 | HR 78 | Temp 97.1°F | Ht 63.5 in | Wt 290.6 lb

## 2021-12-18 DIAGNOSIS — Z0000 Encounter for general adult medical examination without abnormal findings: Secondary | ICD-10-CM | POA: Diagnosis not present

## 2021-12-18 DIAGNOSIS — J302 Other seasonal allergic rhinitis: Secondary | ICD-10-CM

## 2021-12-18 DIAGNOSIS — Z6841 Body Mass Index (BMI) 40.0 and over, adult: Secondary | ICD-10-CM

## 2021-12-18 DIAGNOSIS — D539 Nutritional anemia, unspecified: Secondary | ICD-10-CM

## 2021-12-18 DIAGNOSIS — E559 Vitamin D deficiency, unspecified: Secondary | ICD-10-CM

## 2021-12-18 LAB — HEMOGLOBIN A1C: Hgb A1c MFr Bld: 6.1 % (ref 4.6–6.5)

## 2021-12-18 LAB — CBC WITH DIFFERENTIAL/PLATELET
Basophils Absolute: 0 10*3/uL (ref 0.0–0.1)
Basophils Relative: 0.5 % (ref 0.0–3.0)
Eosinophils Absolute: 0.1 10*3/uL (ref 0.0–0.7)
Eosinophils Relative: 1 % (ref 0.0–5.0)
HCT: 35.2 % — ABNORMAL LOW (ref 36.0–46.0)
Hemoglobin: 11.7 g/dL — ABNORMAL LOW (ref 12.0–15.0)
Lymphocytes Relative: 41.8 % (ref 12.0–46.0)
Lymphs Abs: 2.5 10*3/uL (ref 0.7–4.0)
MCHC: 33.2 g/dL (ref 30.0–36.0)
MCV: 82.6 fl (ref 78.0–100.0)
Monocytes Absolute: 0.8 10*3/uL (ref 0.1–1.0)
Monocytes Relative: 14.3 % — ABNORMAL HIGH (ref 3.0–12.0)
Neutro Abs: 2.5 10*3/uL (ref 1.4–7.7)
Neutrophils Relative %: 42.4 % — ABNORMAL LOW (ref 43.0–77.0)
Platelets: 251 10*3/uL (ref 150.0–400.0)
RBC: 4.26 Mil/uL (ref 3.87–5.11)
RDW: 14.2 % (ref 11.5–15.5)
WBC: 5.9 10*3/uL (ref 4.0–10.5)

## 2021-12-18 MED ORDER — TRIAMCINOLONE ACETONIDE 55 MCG/ACT NA AERO
2.0000 | INHALATION_SPRAY | Freq: Every day | NASAL | 12 refills | Status: AC
Start: 2021-12-18 — End: ?

## 2021-12-18 MED ORDER — AZELASTINE HCL 0.1 % NA SOLN
1.0000 | Freq: Two times a day (BID) | NASAL | 12 refills | Status: DC
  Filled 2022-05-22: qty 30, 30d supply, fill #0
  Filled 2022-10-21: qty 30, 30d supply, fill #1

## 2021-12-18 NOTE — Patient Instructions (Signed)
Use the triamcinolone nose spray and the Astelin nose spray for seasonal allergies as needed. ?We are checking routine screening labs today. ? ?

## 2021-12-18 NOTE — Progress Notes (Signed)
? ?Annual Wellness Visit ? ?  ? ?Patient: Tracey Bean, Female    DOB: [birth year], 36 y.o.   MRN: [id number] ? ?Subjective  ?Chief Complaint  ?Patient presents with  ? Transitions Of Care  ?  TOC . Pt wants to talk about allergies.  ? ? ?Tracey Bean is a 36 y.o. female who presents today for her Annual Wellness Visit. ?Patient returns seasonal allergies, but otherwise patient reports that she is doing well. ?Seasonal allergies remarkably controlled on nasal steroids.  Has tried Zyrtec in the past that did not help.  Has not tried Flonase, but this did not seem to work.  Patient is most relief with triamcinolone/Nasacort nasal spray. ?Patient recently saw OB on 11/2021 who is following her Pap and IUD management. ? ? ? ? ?Patient Active Problem List  ? Diagnosis Date Noted  ? Seasonal allergies 12/18/2021  ? BMI 50.0-59.9, adult ([place]) 12/18/2021  ? Anemia associated with nutritional deficiency 12/18/2021  ? Avitaminosis D 08/31/2009  ? ?Past Medical History:  ?Diagnosis Date  ? Allergy   ? H/O cesarean section 07/05/2014  ? 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 HPV not done.  Colpo should be done next--Done 02/03/14. No Bx  ? Medical history non-contributory   ? PONV (postoperative nausea and vomiting)   ? ?Past Surgical History:  ?Procedure Laterality Date  ? CESAREAN SECTION    ? CESAREAN SECTION N/A 06/02/2014  ? Procedure: Primary Cesarean Section Delivery Baby Girl @ [phone],;  Surgeon: Jonnie Kind, MD;  Location: [facility];  Service: Obstetrics;  Laterality: N/A;  ? ?Social History  ? ?Tobacco Use  ? Smoking status: Never  ? Smokeless tobacco: Never  ?Vaping Use  ? Vaping Use: Never used  ?Substance Use Topics  ? Alcohol use: No  ? Drug use: No  ? ?Social History  ? ?Socioeconomic History  ? Marital status: Married  ?  Spouse name: Not on file  ? Number of children: Not on file  ? Years of education: Not on file  ? Highest education level: Not on file  ?Occupational  History  ? Not on file  ?Tobacco Use  ? Smoking status: Never  ? Smokeless tobacco: Never  ?Vaping Use  ? Vaping Use: Never used  ?Substance and Sexual Activity  ? Alcohol use: No  ? Drug use: No  ? Sexual activity: Yes  ?  Birth control/protection: None  ?Other Topics Concern  ? Not on file  ?Social History Narrative  ? Not on file  ? ?Social Determinants of Health  ? ?Financial Resource Strain: Not on file  ?Food Insecurity: Not on file  ?Transportation Needs: Not on file  ?Physical Activity: Not on file  ?Stress: Not on file  ?Social Connections: Not on file  ?Intimate Partner Violence: Not on file  ? ?Family Status  ?Relation Name Status  ? Mother  Alive  ? Father  Deceased  ? Sister  Alive  ? Daughter  Alive  ? Son  Alive  ? MGM  Deceased  ? MGF  Deceased  ? PGM  Deceased  ? PGF  Deceased  ? Other  Deceased  ? ?Family History  ?Problem Relation Age of Onset  ? Diabetes Mother   ? Diabetes Father   ? Stroke Father   ? Asthma Sister   ? ?Allergies  ?Allergen Reactions  ? Eggs Or Egg-Derived Products Swelling  ? ?  ? ?Medications: ?Outpatient Medications Prior to Visit  ?Medication  Sig  ? Ascorbic Acid (VITAMIN C) 100 MG tablet Take by mouth.  ? calcium carbonate (OSCAL) 1500 (600 Ca) MG TABS tablet Take by mouth 2 (two) times daily with a meal.  ? Multiple Vitamin (MULTIVITAMIN ADULT) TABS Take by mouth.  ? Multiple Vitamins-Minerals (THERA-M) TABS Take by mouth.  ? levonorgestrel (MIRENA) 20 MCG/24HR IUD by Intrauterine route.  ? [DISCONTINUED] cetirizine (ZYRTEC) 10 MG tablet Take 10 mg by mouth daily. (Patient not taking: Reported on 12/18/2021)  ? [DISCONTINUED] cholecalciferol (VITAMIN D3) 25 MCG (1000 UNIT) tablet Take 1,000 Units by mouth daily. (Patient not taking: Reported on 12/18/2021)  ? ?No facility-administered medications prior to visit.  ?  ?Allergies  ?Allergen Reactions  ? Eggs Or Egg-Derived Products Swelling  ? ? ?Patient Care Team: ?Bonnita Hollow, MD as PCP - General (Family  Medicine) ? ?ROS ?As per HPI ? ?  ? ?Objective  ?BP 127/75 (BP Location: Left Arm, Patient Position: Sitting, Cuff Size: Large)   Pulse 78   Temp (!) 97.1 ?F (36.2 ?C) (Temporal)   Ht 5' 3.5" (1.613 m)   Wt 290 lb 9.6 oz (131.8 kg)   SpO2 100%   BMI 50.67 kg/m?  ?BP Readings from Last 3 Encounters:  ?12/18/21 127/75  ?06/30/20 118/70  ?04/10/19 128/86  ? ?  ? ?Physical Exam ? ? ? ?Most recent functional status assessment: ?   ? View : No data to display.  ?  ?  ?  ? ?Most recent fall risk assessment: ? ?  12/18/2021  ?  1:13 PM  ?Fall Risk   ?Falls in the past year? 0  ?Number falls in past yr: 0  ?Injury with Fall? 0  ? ? Most recent depression screenings: ? ?  12/18/2021  ?  1:13 PM 06/30/2020  ?  9:45 AM  ?PHQ 2/9 Scores  ?PHQ - 2 Score 0 0  ? ?Most recent cognitive screening: ?   ? View : No data to display.  ?  ?  ?  ? ?Most recent Audit-C alcohol use screening ?   ? View : No data to display.  ?  ?  ?  ? ?A score of 3 or more in women, and 4 or more in men indicates increased risk for alcohol abuse, EXCEPT if all of the points are from question 1  ? ?Vision/Hearing Screen: ?No results found. ? ?Last CBC ?Lab Results  ?Component Value Date  ? WBC 6.1 06/30/2020  ? HGB 11.9 (L) 06/30/2020  ? HCT 36.3 06/30/2020  ? MCV 82.8 06/30/2020  ? MCH 27.6 06/03/2014  ? RDW 14.7 06/30/2020  ? PLT 231.0 06/30/2020  ? ?Last metabolic panel ?Lab Results  ?Component Value Date  ? GLUCOSE 95 06/30/2020  ? NA 136 06/30/2020  ? K 4.2 06/30/2020  ? CL 103 06/30/2020  ? CO2 27 06/30/2020  ? BUN 12 06/30/2020  ? CREATININE 0.93 06/30/2020  ? CALCIUM 9.0 06/30/2020  ? PROT 8.2 12/12/2007  ? ALBUMIN 4.3 12/12/2007  ? BILITOT 0.3 12/12/2007  ? ALKPHOS 77 12/12/2007  ? AST 15 06/30/2020  ? ALT 14 06/30/2020  ? ?Last lipids ?Lab Results  ?Component Value Date  ? CHOL 108 06/30/2020  ? HDL 38.40 (L) 06/30/2020  ? [place] 58 06/30/2020  ? TRIG 58.0 06/30/2020  ? CHOLHDL 3 06/30/2020  ? ?Last hemoglobin A1c ?No results found for:  HGBA1C ?Last thyroid functions ?No results found for: TSH, T3TOTAL, T4TOTAL, THYROIDAB ?Last vitamin D ?Lab Results  ?  Component Value Date  ? VD25OH 51.15 06/30/2020  ? ?Last vitamin B12 and Folate ?No results found for: VITAMINB12, FOLATE ?  ? ?No results found for any visits on 12/18/21. ?  ? ?Assessment & Plan  ? ?Annual wellness visit done today including the all of the following: ?Reviewed patient's Family Medical History ?Reviewed and updated list of patient's medical providers ?Assessment of cognitive impairment was done ?Assessed patient's functional ability ?Established a written schedule for health screening services ?Health Risk Assessent Completed and Reviewed ? ?Immunization History  ?Administered Date(s) Administered  ? HPV Quadrivalent 10/27/2010, 12/08/2010, 04/13/2011  ? Hepatitis B, ped/adol 06/03/1996, 07/08/1996, 11/30/1996  ? Influenza,inj,Quad PF,6+ Mos 05/05/2014  ? Influenza-Unspecified 06/21/2020  ? PFIZER(Purple Top)SARS-COV-2 Vaccination 04/27/2020, 06/10/2020  ? PPD Test 01/27/2018  ? Td 08/27/2000  ? Tdap 05/20/2009, 03/10/2014  ? ? ?Health Maintenance  ?Topic Date Due  ? COVID-19 Vaccine (3 - Booster for Pfizer series) 05/03/2022 (Originally 08/05/2020)  ? INFLUENZA VACCINE  03/27/2022  ? TETANUS/TDAP  03/10/2024  ? PAP SMEAR-Modifier  04/14/2024  ? HPV VACCINES  Completed  ? Hepatitis C Screening  Completed  ? HIV Screening  Completed  ? ? ? ?Discussed health benefits of physical activity, and encouraged her to engage in regular exercise appropriate for her age and condition.  ?  ?Problem List Items Addressed This Visit   ? ?  ? Other  ? Avitaminosis D  ?  Recently stopped taking OTC vitamin D supplements ?Recheck vitamin D ? ?  ?  ? Seasonal allergies - Primary  ?  Not improved on antihistamines ?Has some relief but not complete on today's steroids ?We will add Astelin to patient's Nasacort ?Follow-up urine ? ?  ?  ? Relevant Medications  ? azelastine (ASTELIN) 0.1 % nasal spray  ?  triamcinolone (NASACORT) 55 MCG/ACT AERO nasal inhaler  ? BMI 50.0-59.9, adult ([place])  ?  Will obtain restratification labs including CMP, lipid panel, hemoglobin A1c ?Patient should continue work on diet and exercise ? ?  ?  ? Relevant

## 2021-12-18 NOTE — Assessment & Plan Note (Signed)
Not improved on antihistamines ?Has some relief but not complete on today's steroids ?We will add Astelin to patient's Nasacort ?Follow-up urine ?

## 2021-12-18 NOTE — Assessment & Plan Note (Signed)
Recently stopped taking OTC vitamin D supplements ?Recheck vitamin D ?

## 2021-12-18 NOTE — Assessment & Plan Note (Signed)
Has had a history of anemia including last CBC approximately a year ago was borderline at 11.9 ?Endorses a history of heavy menses, but this has been controlled on IUD ?Check CBC ?

## 2021-12-18 NOTE — Assessment & Plan Note (Addendum)
Will obtain restratification labs including CMP, lipid panel, hemoglobin A1c ?Patient should continue work on diet and exercise ?

## 2021-12-19 LAB — COMPREHENSIVE METABOLIC PANEL
ALT: 14 U/L (ref 0–35)
AST: 16 U/L (ref 0–37)
Albumin: 4.3 g/dL (ref 3.5–5.2)
Alkaline Phosphatase: 72 U/L (ref 39–117)
BUN: 13 mg/dL (ref 6–23)
CO2: 24 mEq/L (ref 19–32)
Calcium: 9.6 mg/dL (ref 8.4–10.5)
Chloride: 103 mEq/L (ref 96–112)
Creatinine, Ser: 0.82 mg/dL (ref 0.40–1.20)
GFR: 91.54 mL/min (ref >60.00)
Glucose, Bld: 76 mg/dL (ref 70–99)
Potassium: 3.9 mEq/L (ref 3.5–5.1)
Sodium: 135 mEq/L (ref 135–145)
Total Bilirubin: 0.3 mg/dL (ref 0.2–1.2)
Total Protein: 7.9 g/dL (ref 6.0–8.3)

## 2021-12-19 LAB — LIPID PANEL
Cholesterol: 121 mg/dL (ref 0–200)
HDL: 44.2 mg/dL (ref >39.00)
LDL Cholesterol: 63 mg/dL (ref 0–99)
NonHDL: 77.22
Total CHOL/HDL Ratio: 3
Triglycerides: 70 mg/dL (ref 0.0–149.0)
VLDL: 14 mg/dL (ref 0.0–40.0)

## 2021-12-23 LAB — VITAMIN D 1,25 DIHYDROXY
Vitamin D 1, 25 (OH)2 Total: 49 pg/mL (ref 18–72)
Vitamin D2 1, 25 (OH)2: <8 pg/mL
Vitamin D3 1, 25 (OH)2: 49 pg/mL

## 2022-05-22 ENCOUNTER — Other Ambulatory Visit (HOSPITAL_COMMUNITY): Payer: Self-pay

## 2022-06-04 DIAGNOSIS — Z130 Encounter for screening for diseases of the blood and blood-forming organs and certain disorders involving the immune mechanism: Secondary | ICD-10-CM | POA: Diagnosis not present

## 2022-06-04 DIAGNOSIS — Z01419 Encounter for gynecological examination (general) (routine) without abnormal findings: Secondary | ICD-10-CM | POA: Diagnosis not present

## 2022-06-04 DIAGNOSIS — Z1389 Encounter for screening for other disorder: Secondary | ICD-10-CM | POA: Diagnosis not present

## 2022-06-04 DIAGNOSIS — Z30431 Encounter for routine checking of intrauterine contraceptive device: Secondary | ICD-10-CM | POA: Diagnosis not present

## 2022-07-27 DIAGNOSIS — Z419 Encounter for procedure for purposes other than remedying health state, unspecified: Secondary | ICD-10-CM | POA: Diagnosis not present

## 2022-08-27 DIAGNOSIS — Z419 Encounter for procedure for purposes other than remedying health state, unspecified: Secondary | ICD-10-CM | POA: Diagnosis not present

## 2022-09-27 DIAGNOSIS — Z419 Encounter for procedure for purposes other than remedying health state, unspecified: Secondary | ICD-10-CM | POA: Diagnosis not present

## 2022-10-26 DIAGNOSIS — Z419 Encounter for procedure for purposes other than remedying health state, unspecified: Secondary | ICD-10-CM | POA: Diagnosis not present

## 2022-11-19 HISTORY — PX: WISDOM TOOTH EXTRACTION: SHX21

## 2022-11-26 DIAGNOSIS — Z419 Encounter for procedure for purposes other than remedying health state, unspecified: Secondary | ICD-10-CM | POA: Diagnosis not present

## 2022-12-26 DIAGNOSIS — Z419 Encounter for procedure for purposes other than remedying health state, unspecified: Secondary | ICD-10-CM | POA: Diagnosis not present

## 2023-01-18 ENCOUNTER — Encounter: Payer: Self-pay | Admitting: Family Medicine

## 2023-01-18 ENCOUNTER — Ambulatory Visit (INDEPENDENT_AMBULATORY_CARE_PROVIDER_SITE_OTHER): Payer: 59 | Admitting: Family Medicine

## 2023-01-18 VITALS — BP 126/82 | HR 83 | Temp 97.9°F | Ht 63.5 in | Wt 294.6 lb

## 2023-01-18 DIAGNOSIS — D539 Nutritional anemia, unspecified: Secondary | ICD-10-CM | POA: Diagnosis not present

## 2023-01-18 DIAGNOSIS — Z0000 Encounter for general adult medical examination without abnormal findings: Secondary | ICD-10-CM | POA: Insufficient documentation

## 2023-01-18 DIAGNOSIS — Z975 Presence of (intrauterine) contraceptive device: Secondary | ICD-10-CM

## 2023-01-18 DIAGNOSIS — E559 Vitamin D deficiency, unspecified: Secondary | ICD-10-CM

## 2023-01-18 DIAGNOSIS — J302 Other seasonal allergic rhinitis: Secondary | ICD-10-CM

## 2023-01-18 NOTE — Assessment & Plan Note (Signed)
Discussion of iron supplementation effectiveness and possible modifications. Referral to hematology to rule out other causes of anemia.

## 2023-01-18 NOTE — Assessment & Plan Note (Signed)
Discussed current weight and BMI. Emphasized the importance of continued diet and exercise. Consider referral to weight management programs or bariatric specialist. Lab work ordered to monitor effects of obesity on health parameters.

## 2023-01-18 NOTE — Assessment & Plan Note (Signed)
Follow-up on previous tests indicating low vitamin D levels. Discussed reinitiation of vitamin D supplementation.

## 2023-01-18 NOTE — Progress Notes (Signed)
Assessment  Assessment/Plan:   Problem List Items Addressed This Visit       Other   Class 3 severe obesity without serious comorbidity in adult [facility])    Discussed current weight and BMI. Emphasized the importance of continued diet and exercise. Consider referral to weight management programs or bariatric specialist. Lab work ordered to monitor effects of obesity on health parameters.      Relevant Orders   TSH   Lipid panel   Hemoglobin A1c   Microalbumin / creatinine urine ratio   Urinalysis, Routine w reflex microscopic   Comprehensive metabolic panel   Comprehensive metabolic panel   Hemoglobin A1c   Lipid panel   Microalbumin / creatinine urine ratio   TSH   Urinalysis, Routine w reflex microscopic   Avitaminosis D - Primary    Follow-up on previous tests indicating low vitamin D levels. Discussed reinitiation of vitamin D supplementation.      Relevant Orders   Vitamin D 1,25 dihydroxy   Vitamin D 1,25 dihydroxy   Seasonal allergies    Currently well-controlled. Continue with existing medications as needed.      Anemia associated with nutritional deficiency    Discussion of iron supplementation effectiveness and possible modifications. Referral to hematology to rule out other causes of anemia.      Relevant Medications   Iron, Ferrous Sulfate, 325 (65 Fe) MG TABS   Cobalamin Combinations (B-12) [phone] MCG SUBL   Other Relevant Orders   Ambulatory referral to Hematology / Oncology   CBC with Differential/Platelet   Vitamin B12   Folate   Iron and TIBC   Ferritin   CBC with Differential/Platelet   Comprehensive metabolic panel   Ferritin   Folate   Iron and TIBC   Vitamin B12   Encounter for well adult exam without abnormal findings   Other Visit Diagnoses     IUD contraception           There are no discontinued medications.  Patient Counseling(The following topics were reviewed and/or handout was given):  -Nutrition: Stressed importance  of moderation in sodium/caffeine intake, saturated fat and cholesterol, caloric balance, sufficient intake of fresh fruits, vegetables, and fiber.  -Stressed the importance of regular exercise.   -Substance Abuse: Discussed cessation/primary prevention of tobacco, alcohol, or other drug use; driving or other dangerous activities under the influence; availability of treatment for abuse.   -Injury prevention: Discussed safety belts, safety helmets, smoke detector, smoking near bedding or upholstery.   -Sexuality: Discussed sexually transmitted diseases, partner selection, use of condoms, avoidance of unintended pregnancy and contraceptive alternatives.   -Dental health: Discussed importance of regular tooth brushing, flossing, and dental visits.  -Health maintenance and immunizations reviewed. Please refer to Health maintenance section.  Return to care in 1 year for next preventative visit.       Subjective:  Chief complaint Encounter date: 01/18/2023  Chief Complaint  Patient presents with   Annual Exam    Fasting. Would like to discuss iron and how she should take her iron pills.    Tracey Bean is a 37 y.o. female who presents today for her annual comprehensive physical exam.     Review of Systems  Constitutional:  Positive for malaise/fatigue. Negative for chills, diaphoresis, fever and weight loss.  HENT:  Negative for congestion, ear discharge, ear pain and hearing loss.   Eyes:  Negative for blurred vision, double vision, photophobia, pain, discharge and redness.  Respiratory:  Negative for cough,  sputum production, shortness of breath and wheezing.   Cardiovascular:  Negative for chest pain and palpitations.  Gastrointestinal:  Negative for abdominal pain, blood in stool, constipation, diarrhea, heartburn, melena, nausea and vomiting.  Genitourinary:  Negative for dysuria, flank pain, frequency, hematuria and urgency.  Musculoskeletal:  Negative for myalgias.  Skin:   Negative for itching and rash.  Neurological:  Negative for dizziness, tingling, tremors, speech change, seizures, loss of consciousness, weakness and headaches.  Endo/Heme/Allergies:  Positive for environmental allergies. Negative for polydipsia.  Psychiatric/Behavioral:  Negative for depression, hallucinations, memory loss, substance abuse and suicidal ideas. The patient does not have insomnia.   All other systems reviewed and are negative.    There are no preventive care reminders to display for this patient.     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  Colpo should be done next--Done 02/03/14. No Bx   Medical history non-contributory    PONV (postoperative nausea and vomiting)     Patient Active Problem List   Diagnosis Date Noted   Encounter for well adult exam without abnormal findings 01/18/2023   Seasonal allergies 12/18/2021   BMI 50.0-59.9, adult (HCC) 12/18/2021   Anemia associated with nutritional deficiency 12/18/2021   Avitaminosis D 08/31/2009   Class 3 severe obesity without serious comorbidity in adult [facility]) 10/16/2006    Past Surgical History:  Procedure Laterality Date   CESAREAN SECTION     CESAREAN SECTION N/A 06/02/2014   Procedure: Primary Cesarean Section Delivery Baby Girl @ 1834,;  Surgeon: Tilda Burrow, MD;  Location: [facility];  Service: Obstetrics;  Laterality: N/A;   WISDOM TOOTH EXTRACTION  11/19/2022    Family History  Problem Relation Age of Onset   Diabetes Mother    Anemia Mother    Diabetes Father    Stroke Father    Asthma Sister     Medications- reviewed and updated Outpatient Medications Prior to Visit  Medication Sig Dispense Refill   Apple Cider Vinegar 500 MG TABS      Ascorbic Acid (VITAMIN C) 100 MG tablet Take by mouth.     azelastine  (ASTELIN) 0.1 % nasal spray Place 1 spray into both nostrils 2 (two) times a day as directed. 30 mL 12   B Complex Vitamins (B COMPLEX 1 PO)      calcium carbonate (OSCAL) 1500 (600 Ca) MG TABS tablet Take by mouth 2 (two) times daily with a meal.     Cobalamin Combinations (B-12) [phone] MCG SUBL      Cranberry 500 MG CAPS      Iron, Ferrous Sulfate, 325 (65 Fe) MG TABS      levonorgestrel (MIRENA) 20 MCG/24HR IUD by Intrauterine route.     Multiple Vitamin (MULTIVITAMIN ADULT) TABS Take by mouth.     Multiple Vitamins-Minerals (THERA-M) TABS Take by mouth.     triamcinolone (NASACORT) 55 MCG/ACT AERO nasal inhaler Place 2 sprays into the nose daily. 1 each 12   No facility-administered medications prior to visit.    Allergies  Allergen Reactions   Egg-Derived Products Swelling    Social History   Socioeconomic History   Marital status: Married    Spouse name: Not on file   Number of children: Not on file   Years of education: Not on file  Highest education level: Not on file  Occupational History   Not on file  Tobacco Use   Smoking status: Never    Passive exposure: Never   Smokeless tobacco: Never  Vaping Use   Vaping Use: Never used  Substance and Sexual Activity   Alcohol use: No   Drug use: No   Sexual activity: Yes    Birth control/protection: None  Other Topics Concern   Not on file  Social History Narrative   Not on file   Social Determinants of Health   Financial Resource Strain: Not on file  Food Insecurity: Not on file  Transportation Needs: Not on file  Physical Activity: Not on file  Stress: Not on file  Social Connections: Not on file        Objective:  Physical Exam: BP 126/82 (BP Location: Left Arm, Patient Position: Sitting, Cuff Size: Large)   Pulse 83   Temp 97.9 F (36.6 C) (Temporal)   Ht 5' 3.5" (1.613 m)   Wt 294 lb 9.6 oz (133.6 kg)   SpO2 97%   BMI 51.37 kg/m   Body mass index is 51.37 kg/m. Wt Readings from Last 3  Encounters:  01/18/23 294 lb 9.6 oz (133.6 kg)  12/18/21 290 lb 9.6 oz (131.8 kg)  06/30/20 298 lb 9.6 oz (135.4 kg)    Physical Exam Constitutional:      General: She is not in acute distress.    Appearance: Normal appearance. She is not ill-appearing or toxic-appearing.  HENT:     Head: Normocephalic and atraumatic.     Right Ear: Hearing, tympanic membrane, ear canal and external ear normal. There is no impacted cerumen.     Left Ear: Hearing, tympanic membrane, ear canal and external ear normal. There is no impacted cerumen.     Nose: Nose normal. No congestion.     Mouth/Throat:     Lips: No lesions.     Mouth: Mucous membranes are moist.     Pharynx: Oropharynx is clear. No oropharyngeal exudate.  Eyes:     General: No scleral icterus.       Right eye: No discharge.        Left eye: No discharge.     Extraocular Movements: Extraocular movements intact.     Conjunctiva/sclera: Conjunctivae normal.     Pupils: Pupils are equal, round, and reactive to light.  Neck:     Thyroid: No thyroid mass, thyromegaly or thyroid tenderness.  Cardiovascular:     Rate and Rhythm: Normal rate and regular rhythm.     Pulses: Normal pulses.     Heart sounds: Normal heart sounds.  Pulmonary:     Effort: Pulmonary effort is normal. No respiratory distress.     Breath sounds: Normal breath sounds.  Abdominal:     General: Abdomen is flat. Bowel sounds are normal.     Palpations: Abdomen is soft.  Musculoskeletal:        General: Normal range of motion.     Cervical back: Normal range of motion.     Right lower leg: No edema.     Left lower leg: No edema.  Lymphadenopathy:     Cervical: No cervical adenopathy.  Skin:    General: Skin is warm and dry.     Findings: No rash.  Neurological:     General: No focal deficit present.     Mental Status: She is alert and oriented to person, place, and time. Mental status is at baseline.  Deep Tendon Reflexes:     Reflex Scores:       Patellar reflexes are 2+ on the right side and 2+ on the left side. Psychiatric:        Mood and Affect: Mood normal.        Behavior: Behavior normal.        Thought Content: Thought content normal.        Judgment: Judgment normal.        Lab Results  Component Value Date   CHOL 121 12/18/2021   HDL 44.20 12/18/2021   LDLCALC 63 12/18/2021   TRIG 70.0 12/18/2021   CHOLHDL 3 12/18/2021   Lab Results  Component Value Date   WBC 5.9 12/18/2021   HGB 11.7 (L) 12/18/2021   HCT 35.2 (L) 12/18/2021   MCV 82.6 12/18/2021   PLT 251.0 12/18/2021   Last metabolic panel Lab Results  Component Value Date   GLUCOSE 76 12/18/2021   NA 135 12/18/2021   K 3.9 12/18/2021   CL 103 12/18/2021   CO2 24 12/18/2021   BUN 13 12/18/2021   CREATININE 0.82 12/18/2021   CALCIUM 9.6 12/18/2021   PROT 7.9 12/18/2021   ALBUMIN 4.3 12/18/2021   BILITOT 0.3 12/18/2021   ALKPHOS 72 12/18/2021   AST 16 12/18/2021   ALT 14 12/18/2021    At today's visit, we discussed treatment options, associated risk and benefits, and engage in counseling as needed.  Additionally the following were reviewed: Past medical records, past medical and surgical history, family and social background, as well as relevant laboratory results, imaging findings, and specialty notes, where applicable.  This message was generated using dictation software, and as a result, it may contain unintentional typos or errors.  Nevertheless, extensive effort was made to accurately convey at the pertinent aspects of the patient visit.    There may have been are other unrelated non-urgent complaints, but due to the busy schedule and the amount of time already spent with her, time does not permit to address these issues at today's visit. Another appointment may have or has been requested to review these additional issues.   Thomes Dinning, MD, MS

## 2023-01-18 NOTE — Assessment & Plan Note (Signed)
Currently well-controlled. Continue with existing medications as needed.

## 2023-01-18 NOTE — Patient Instructions (Signed)
1. General Recommendations:  Diet: Continue incorporating more vegetables and maintain your frequency of eating out to no more than once a week. Exercise: Keep up the excellent work with working out 3-4 times a week. Consistent exercise is beneficial for heart disease prevention. Weight Management: Monitor your weight as it can affect cholesterol, blood sugar, and blood pressure. Aim to maintain a healthy weight to reduce the risk of heart disease.  2. Anemia Management:  Iron Supplementation: Consider taking iron pills every day if you can tolerate them well as it can ensure maximum benefit. Missing a day occasionally should not notably affect your energy levels. Hematology Referral: A referral to a hematologist is recommended to explore potential underlying causes of your persistent anemia and ensure appropriate treatment.

## 2023-01-19 LAB — URINALYSIS, ROUTINE W REFLEX MICROSCOPIC
Glucose, UA: NEGATIVE
Ketones, UA: NEGATIVE
Specific Gravity, UA: 1.021 (ref 1.005–1.030)
pH, UA: 7 (ref 5.0–7.5)

## 2023-01-19 LAB — IRON AND TIBC
Iron Saturation: 30 % (ref 15–55)
Total Iron Binding Capacity: 286 ug/dL (ref 250–450)

## 2023-01-19 LAB — COMPREHENSIVE METABOLIC PANEL
Albumin/Globulin Ratio: 1.4 (ref 1.2–2.2)
Albumin: 4.4 g/dL (ref 3.9–4.9)
Alkaline Phosphatase: 86 IU/L (ref 44–121)
BUN/Creatinine Ratio: 16 (ref 9–23)
Total Protein: 7.5 g/dL (ref 6.0–8.5)

## 2023-01-19 LAB — LIPID PANEL: HDL: 47 mg/dL (ref >39)

## 2023-01-19 LAB — FOLATE

## 2023-01-20 LAB — URINALYSIS, ROUTINE W REFLEX MICROSCOPIC
Bilirubin, UA: NEGATIVE
Leukocytes,UA: NEGATIVE
Nitrite, UA: NEGATIVE
Protein,UA: NEGATIVE
RBC, UA: NEGATIVE
Urobilinogen, Ur: 0.2 mg/dL (ref 0.2–1.0)

## 2023-01-20 LAB — COMPREHENSIVE METABOLIC PANEL
ALT: 21 IU/L (ref 0–32)
AST: 19 IU/L (ref 0–40)
BUN: 13 mg/dL (ref 6–20)
Bilirubin Total: 0.4 mg/dL (ref 0.0–1.2)
CO2: 24 mmol/L (ref 20–29)
Calcium: 9.2 mg/dL (ref 8.7–10.2)
Chloride: 104 mmol/L (ref 96–106)
Creatinine, Ser: 0.81 mg/dL (ref 0.57–1.00)
Globulin, Total: 3.1 g/dL (ref 1.5–4.5)
Glucose: 92 mg/dL (ref 70–99)
Potassium: 4.8 mmol/L (ref 3.5–5.2)
Sodium: 139 mmol/L (ref 134–144)
eGFR: 95 mL/min/{1.73_m2} (ref >59)

## 2023-01-20 LAB — MICROALBUMIN / CREATININE URINE RATIO
Creatinine, Urine: 145.3 mg/dL
Microalb/Creat Ratio: 2 mg/g creat (ref 0–29)
Microalbumin, Urine: 3.6 ug/mL

## 2023-01-20 LAB — FERRITIN: Ferritin: 85 ng/mL (ref 15–150)

## 2023-01-20 LAB — LIPID PANEL
Chol/HDL Ratio: 2.8 ratio (ref 0.0–4.4)
Cholesterol, Total: 130 mg/dL (ref 100–199)
LDL Chol Calc (NIH): 71 mg/dL (ref 0–99)
Triglycerides: 55 mg/dL (ref 0–149)
VLDL Cholesterol Cal: 12 mg/dL (ref 5–40)

## 2023-01-20 LAB — CBC WITH DIFFERENTIAL/PLATELET

## 2023-01-20 LAB — IRON AND TIBC
Iron: 87 ug/dL (ref 27–159)
UIBC: 199 ug/dL (ref 131–425)

## 2023-01-20 LAB — HEMOGLOBIN A1C

## 2023-01-20 LAB — TSH: TSH: 1.49 u[IU]/mL (ref 0.450–4.500)

## 2023-01-20 LAB — VITAMIN B12: Vitamin B-12: >2000 pg/mL — ABNORMAL HIGH (ref 232–1245)

## 2023-01-22 LAB — EXTRA LAV TOP TUBE

## 2023-01-22 LAB — VITAMIN D 1,25 DIHYDROXY
Vitamin D 1, 25 (OH)2 Total: 62 pg/mL (ref 18–72)
Vitamin D2 1, 25 (OH)2: <8 pg/mL
Vitamin D3 1, 25 (OH)2: 62 pg/mL

## 2023-01-23 ENCOUNTER — Other Ambulatory Visit: Payer: Self-pay

## 2023-01-23 DIAGNOSIS — D539 Nutritional anemia, unspecified: Secondary | ICD-10-CM

## 2023-01-23 NOTE — Progress Notes (Signed)
Spoke with patient and advised her that a lavender tube wasn't processed for her CBC W/ DIFF & HEMOGLOBIN A1C. I apologized for the inconvenience and I offered a lab visit and pt agreed for 01/31/2023 at 8:45am.

## 2023-01-31 ENCOUNTER — Other Ambulatory Visit: Payer: 59

## 2023-02-08 ENCOUNTER — Other Ambulatory Visit (INDEPENDENT_AMBULATORY_CARE_PROVIDER_SITE_OTHER): Payer: 59

## 2023-02-08 DIAGNOSIS — D539 Nutritional anemia, unspecified: Secondary | ICD-10-CM | POA: Diagnosis not present

## 2023-02-08 LAB — CBC WITH DIFFERENTIAL/PLATELET
Basophils Absolute: 0 10*3/uL (ref 0.0–0.1)
Basophils Relative: 0.4 % (ref 0.0–3.0)
Eosinophils Absolute: 0 10*3/uL (ref 0.0–0.7)
Eosinophils Relative: 0.8 % (ref 0.0–5.0)
HCT: 38.4 % (ref 36.0–46.0)
Hemoglobin: 12.3 g/dL (ref 12.0–15.0)
Lymphocytes Relative: 41.4 % (ref 12.0–46.0)
Lymphs Abs: 2.1 10*3/uL (ref 0.7–4.0)
MCHC: 32 g/dL (ref 30.0–36.0)
MCV: 83.5 fl (ref 78.0–100.0)
Monocytes Absolute: 0.7 10*3/uL (ref 0.1–1.0)
Monocytes Relative: 12.7 % — ABNORMAL HIGH (ref 3.0–12.0)
Neutro Abs: 2.3 10*3/uL (ref 1.4–7.7)
Neutrophils Relative %: 44.7 % (ref 43.0–77.0)
Platelets: 248 10*3/uL (ref 150.0–400.0)
RBC: 4.59 Mil/uL (ref 3.87–5.11)
RDW: 14.8 % (ref 11.5–15.5)
WBC: 5.1 10*3/uL (ref 4.0–10.5)

## 2023-02-08 LAB — HEMOGLOBIN A1C: Hgb A1c MFr Bld: 5.8 % (ref 4.6–6.5)

## 2023-03-07 ENCOUNTER — Other Ambulatory Visit: Payer: Self-pay | Admitting: Family Medicine

## 2023-03-07 ENCOUNTER — Other Ambulatory Visit (HOSPITAL_COMMUNITY): Payer: Self-pay

## 2023-03-07 DIAGNOSIS — J302 Other seasonal allergic rhinitis: Secondary | ICD-10-CM

## 2023-03-07 MED ORDER — AZELASTINE HCL 0.1 % NA SOLN
1.0000 | Freq: Two times a day (BID) | NASAL | 12 refills | Status: DC
  Filled 2023-03-07: qty 30, 90d supply, fill #0
  Filled 2023-09-04: qty 30, 90d supply, fill #1
  Filled 2024-02-18: qty 30, 90d supply, fill #2

## 2023-03-13 ENCOUNTER — Other Ambulatory Visit (HOSPITAL_COMMUNITY): Payer: Self-pay

## 2024-02-10 DIAGNOSIS — Z130 Encounter for screening for diseases of the blood and blood-forming organs and certain disorders involving the immune mechanism: Secondary | ICD-10-CM | POA: Diagnosis not present

## 2024-02-10 DIAGNOSIS — Z01419 Encounter for gynecological examination (general) (routine) without abnormal findings: Secondary | ICD-10-CM | POA: Diagnosis not present

## 2024-02-10 DIAGNOSIS — Z1389 Encounter for screening for other disorder: Secondary | ICD-10-CM | POA: Diagnosis not present

## 2024-02-10 DIAGNOSIS — Z30431 Encounter for routine checking of intrauterine contraceptive device: Secondary | ICD-10-CM | POA: Diagnosis not present

## 2024-07-22 ENCOUNTER — Other Ambulatory Visit: Payer: Self-pay | Admitting: Family Medicine

## 2024-07-22 DIAGNOSIS — J302 Other seasonal allergic rhinitis: Secondary | ICD-10-CM

## 2024-07-24 ENCOUNTER — Other Ambulatory Visit (HOSPITAL_COMMUNITY): Payer: Self-pay

## 2024-07-24 MED ORDER — AZELASTINE HCL 0.1 % NA SOLN
1.0000 | Freq: Two times a day (BID) | NASAL | 12 refills | Status: AC
Start: 2024-07-24 — End: ?
  Filled 2024-07-24: qty 30, 30d supply, fill #0

## 2024-07-25 ENCOUNTER — Other Ambulatory Visit (HOSPITAL_COMMUNITY): Payer: Self-pay
